# Patient Record
Sex: Female | Born: 1990 | Race: White | Hispanic: No | Smoking: Never smoker
Health system: Southern US, Community
[De-identification: ages and names within clinical notes are randomized; demographics above are authoritative.]

## PROBLEM LIST (undated history)

## (undated) DIAGNOSIS — F419 Anxiety disorder, unspecified: Secondary | ICD-10-CM

## (undated) DIAGNOSIS — G919 Hydrocephalus, unspecified: Secondary | ICD-10-CM

---

## 2014-09-02 DIAGNOSIS — N946 Dysmenorrhea, unspecified: Secondary | ICD-10-CM | POA: Insufficient documentation

## 2018-12-05 ENCOUNTER — Ambulatory Visit
Admission: EM | Admit: 2018-12-05 | Discharge: 2018-12-05 | Disposition: A | Discharge status: Home / Self Care | Payer: BLUE CROSS/BLUE SHIELD | Attending: Family Medicine | Admitting: Family Medicine

## 2018-12-05 ENCOUNTER — Encounter: Payer: Self-pay | Admitting: Emergency Medicine

## 2018-12-05 ENCOUNTER — Other Ambulatory Visit: Payer: Self-pay

## 2018-12-05 DIAGNOSIS — J069 Acute upper respiratory infection, unspecified: Secondary | ICD-10-CM | POA: Insufficient documentation

## 2018-12-05 LAB — RAPID STREP SCREEN (MED CTR MEBANE ONLY): Streptococcus, Group A Screen (Direct): NEGATIVE

## 2018-12-05 MED ORDER — IPRATROPIUM BROMIDE 0.06 % NA SOLN: 2.0000 | Freq: Four times a day (QID) | NASAL | 0 refills | Status: DC | PRN

## 2018-12-05 MED ORDER — CETIRIZINE-PSEUDOEPHEDRINE ER 5-120 MG PO TB12: 1.0000 | ORAL_TABLET | Freq: Two times a day (BID) | ORAL | 0 refills | Status: AC

## 2018-12-05 NOTE — ED Triage Notes (Signed)
Patient c/o sore throat and sinus congestion and pressure for the past 3 days.  Patient denies fevers.

## 2018-12-05 NOTE — Discharge Instructions (Signed)
This is viral.  Strep negative.  Medication as directed.  You can stop your OTC antihistamine currently.  Take care  Dr. Lacinda Axon

## 2018-12-05 NOTE — ED Provider Notes (Signed)
[facility]    CSN: [id number] Arrival date & time: 12/05/18  0932  History   Chief Complaint Chief Complaint  Patient presents with  . Sore Throat   HPI  28 year old female presents with upper respiratory symptoms.  Patient reports that she has been sick for the past 3 days.  Reports sore throat, congestion.  Sore throat is severe.  She also reports postnasal drip.  No fever.  She has taken over-the-counter allergy medication without improvement.  Symptoms are interfering with sleep.  No known exacerbating factors.  No other associated symptoms.  No other complaints.  History reviewed and updated as below. PMH: Dysmenorrhea  Home Medications    Prior to Admission medications   Medication Sig Start Date End Date Taking? Authorizing Provider  Norgestimate-Ethinyl Estradiol Triphasic (TRI-SPRINTEC) 0.18/0.215/0.25 MG-35 MCG tablet TAKE ONE TABLET BY MOUTH ONCE DAILY 07/14/18  Yes [provider]  cetirizine-pseudoephedrine (ZYRTEC-D) 5-120 MG tablet Take 1 tablet by mouth 2 (two) times daily. 12/05/18   Thersa Salt G, DO  ipratropium (ATROVENT) 0.06 % nasal spray Place 2 sprays into both nostrils 4 (four) times daily as needed for rhinitis. 12/05/18   Coral Spikes, DO   Family History Family History  Family history unknown: Yes   Social History Social History   Tobacco Use  . Smoking status: Never Smoker  . Smokeless tobacco: Never Used  Substance Use Topics  . Alcohol use: Never    Frequency: Never  . Drug use: Never     Allergies   Patient has no known allergies.   Review of Systems Review of Systems  Constitutional: Negative for fever.  HENT: Positive for congestion, postnasal drip and sore throat.    Physical Exam Triage Vital Signs ED Triage Vitals  Enc Vitals Group     BP 12/05/18 0957 123/78     Pulse Rate 12/05/18 0957 79     Resp 12/05/18 0957 14     Temp 12/05/18 0957 98.4 F (36.9 C)     Temp Source 12/05/18 0957 Oral   SpO2 12/05/18 0957 100 %     Weight 12/05/18 0954 170 lb (77.1 kg)     Height 12/05/18 0954 5\' 4"  (1.626 m)     Head Circumference --      Peak Flow --      Pain Score 12/05/18 0954 6     Pain Loc --      Pain Edu? --      Excl. in [place]? --    Updated Vital Signs BP 123/78 (BP Location: Left Arm)   Pulse 79   Temp 98.4 F (36.9 C) (Oral)   Resp 14   Ht 5\' 4"  (1.626 m)   Wt 77.1 kg   LMP 11/28/2018 (Approximate)   SpO2 100%   BMI 29.18 kg/m   Visual Acuity Right Eye Distance:   Left Eye Distance:   Bilateral Distance:    Right Eye Near:   Left Eye Near:    Bilateral Near:     Physical Exam Vitals signs and nursing note reviewed.  Constitutional:      General: She is not in acute distress. HENT:     Head: Normocephalic and atraumatic.     Right Ear: Tympanic membrane normal.     Left Ear: Tympanic membrane normal.     Nose: Nose normal.     Mouth/Throat:     Pharynx: Oropharynx is clear. No oropharyngeal exudate.  Eyes:     General:  Right eye: No discharge.        Left eye: No discharge.     Conjunctiva/sclera: Conjunctivae normal.  Cardiovascular:     Rate and Rhythm: Normal rate and regular rhythm.  Pulmonary:     Effort: Pulmonary effort is normal.     Breath sounds: No wheezing, rhonchi or rales.  Neurological:     Mental Status: She is alert.  Psychiatric:        Mood and Affect: Mood normal.        Behavior: Behavior normal.    UC Treatments / Results  Labs (all labs ordered are listed, but only abnormal results are displayed) Labs Reviewed  RAPID STREP SCREEN ([facility] ONLY)  CULTURE, GROUP A STREP [name])    EKG None  Radiology No results found.  Procedures Procedures (including critical care time)  Medications Ordered in UC Medications - No data to display  Initial Impression / Assessment and Plan / UC Course  I have reviewed the triage vital signs and the nursing notes.  Pertinent labs & imaging results that were  available during my care of the patient were reviewed by me and considered in my medical decision making (see chart for details).    28 year old female presents with a viral URI.  Strep negative.  Treating with Atrovent nasal spray and Zyrtec-D.  Final Clinical Impressions(s) / UC Diagnoses   Final diagnoses:  Viral URI     Discharge Instructions     This is viral.  Strep negative.  Medication as directed.  You can stop your OTC antihistamine currently.  Take care  Dr. Lacinda Axon    ED Prescriptions    Medication Sig Dispense Auth. Provider   ipratropium (ATROVENT) 0.06 % nasal spray Place 2 sprays into both nostrils 4 (four) times daily as needed for rhinitis. 15 mL [name] G, DO   cetirizine-pseudoephedrine (ZYRTEC-D) 5-120 MG tablet Take 1 tablet by mouth 2 (two) times daily. 30 tablet Coral Spikes, DO     Controlled Substance Prescriptions [place] Controlled Substance Registry consulted? Not Applicable   Coral Spikes, DO 12/05/18 1032

## 2018-12-08 LAB — CULTURE, GROUP A STREP (THRC)

## 2020-03-30 ENCOUNTER — Ambulatory Visit
Admission: EM | Admit: 2020-03-30 | Discharge: 2020-03-30 | Disposition: A | Discharge status: Home / Self Care | Payer: PRIVATE HEALTH INSURANCE | Attending: Family Medicine | Admitting: Family Medicine

## 2020-03-30 ENCOUNTER — Other Ambulatory Visit: Payer: Self-pay

## 2020-03-30 ENCOUNTER — Encounter: Payer: Self-pay | Admitting: Emergency Medicine

## 2020-03-30 DIAGNOSIS — L03213 Periorbital cellulitis: Secondary | ICD-10-CM | POA: Diagnosis not present

## 2020-03-30 DIAGNOSIS — H10022 Other mucopurulent conjunctivitis, left eye: Secondary | ICD-10-CM

## 2020-03-30 MED ORDER — SULFAMETHOXAZOLE-TRIMETHOPRIM 800-160 MG PO TABS: 1.0000 | ORAL_TABLET | Freq: Two times a day (BID) | ORAL | 0 refills | Status: DC

## 2020-03-30 MED ORDER — MOXIFLOXACIN HCL 0.5 % OP SOLN: 1.0000 [drp] | Freq: Three times a day (TID) | OPHTHALMIC | 0 refills | Status: DC

## 2020-03-30 NOTE — ED Triage Notes (Signed)
Patient c/o left eye swelling that started yesterday. She states she felt like something got into her left eye yesterday.

## 2020-04-02 NOTE — ED Provider Notes (Signed)
[facility]    CSN: [id number] Arrival date & time: 03/30/20  1247      History   Chief Complaint Chief Complaint  Patient presents with  . Facial Swelling    HPI Beth Zhang is a 29 y.o. female.   29 yo female with a c/o left eyelids swelling, eye redness and drainage for the past 2 days. Denies any trauma, fevers, chills, vision changes. States does not wear contacts.      History reviewed. No pertinent past medical history.  There are no problems to display for this patient.   History reviewed. No pertinent surgical history.  OB History   No obstetric history on file.      Home Medications    Prior to Admission medications   Medication Sig Start Date End Date Taking? Authorizing Provider  cetirizine-pseudoephedrine (ZYRTEC-D) 5-120 MG tablet Take 1 tablet by mouth 2 (two) times daily. 12/05/18  Yes Cook, Jayce G, DO  ipratropium (ATROVENT) 0.06 % nasal spray Place 2 sprays into both nostrils 4 (four) times daily as needed for rhinitis. 12/05/18  Yes Cook, Jayce G, DO  Norgestimate-Ethinyl Estradiol Triphasic (TRI-SPRINTEC) 0.18/0.215/0.25 MG-35 MCG tablet TAKE ONE TABLET BY MOUTH ONCE DAILY 07/14/18  Yes [provider]  moxifloxacin (VIGAMOX) 0.5 % ophthalmic solution Place 1 drop into the left eye 3 (three) times daily. 03/30/20   Norval Gable, MD  sulfamethoxazole-trimethoprim (BACTRIM DS) 800-160 MG tablet Take 1 tablet by mouth 2 (two) times daily. 03/30/20   Norval Gable, MD    Family History Family History  Family history unknown: Yes    Social History Social History   Tobacco Use  . Smoking status: Never Smoker  . Smokeless tobacco: Never Used  Substance Use Topics  . Alcohol use: Never  . Drug use: Never     Allergies   Patient has no known allergies.   Review of Systems Review of Systems   Physical Exam Triage Vital Signs ED Triage Vitals  Enc Vitals Group     BP 03/30/20 1305 (!) 145/96     Pulse  Rate 03/30/20 1305 97     Resp 03/30/20 1305 18     Temp 03/30/20 1305 98.5 F (36.9 C)     Temp Source 03/30/20 1305 Oral     SpO2 03/30/20 1305 100 %     Weight 03/30/20 1304 170 lb (77.1 kg)     Height 03/30/20 1304 5\' 4"  (1.626 m)     Head Circumference --      Peak Flow --      Pain Score 03/30/20 1304 0     Pain Loc --      Pain Edu? --      Excl. in [place]? --    No data found.  Updated Vital Signs BP (!) 145/96 (BP Location: Right Arm)   Pulse 97   Temp 98.5 F (36.9 C) (Oral)   Resp 18   Ht 5\' 4"  (1.626 m)   Wt 77.1 kg   LMP 03/09/2020   SpO2 100%   BMI 29.18 kg/m   Visual Acuity Right Eye Distance: 20/30 uncorrected Left Eye Distance: 20/40 uncorrected Bilateral Distance: 20/40 uncorrected  Right Eye Near:   Left Eye Near:    Bilateral Near:     Physical Exam Vitals and nursing note reviewed.  Constitutional:      General: She is not in acute distress.    Appearance: She is not toxic-appearing or diaphoretic.  Eyes:  General:        Left eye: Discharge present.    Extraocular Movements: Extraocular movements intact.     Conjunctiva/sclera:     Left eye: Left conjunctiva is injected. Exudate present.     Pupils: Pupils are equal, round, and reactive to light.     Left eye: No corneal abrasion or fluorescein uptake.     Comments: Left upper and lower eyelid with edema, blanchable erythema and tenderness to palpation  Neurological:     Mental Status: She is alert.      UC Treatments / Results  Labs (all labs ordered are listed, but only abnormal results are displayed) Labs Reviewed - No data to display  EKG   Radiology No results found.  Procedures Procedures (including critical care time)  Medications Ordered in UC Medications - No data to display  Initial Impression / Assessment and Plan / UC Course  I have reviewed the triage vital signs and the nursing notes.  Pertinent labs & imaging results that were available during my care  of the patient were reviewed by me and considered in my medical decision making (see chart for details).      Final Clinical Impressions(s) / UC Diagnoses   Final diagnoses:  Other mucopurulent conjunctivitis of left eye  Preseptal cellulitis of left eye    ED Prescriptions    Medication Sig Dispense Auth. Provider   moxifloxacin (VIGAMOX) 0.5 % ophthalmic solution Place 1 drop into the left eye 3 (three) times daily. 3 mL Norval Gable, MD   sulfamethoxazole-trimethoprim (BACTRIM DS) 800-160 MG tablet Take 1 tablet by mouth 2 (two) times daily. 14 tablet Erica Richwine, Linward Foster, MD      1. diagnosis reviewed with patient 2. rx as per orders above; reviewed possible side effects, interactions, risks and benefits  3. Recommend supportive treatment with warm compresses to area; otc analgesics prn  4. Follow-up prn if symptoms worsen or don't improve  PDMP not reviewed this encounter.   Norval Gable, MD 04/02/20 [phone]

## 2021-02-24 ENCOUNTER — Encounter: Payer: Self-pay | Admitting: Emergency Medicine

## 2021-02-24 ENCOUNTER — Other Ambulatory Visit: Payer: Self-pay

## 2021-02-24 ENCOUNTER — Ambulatory Visit (INDEPENDENT_AMBULATORY_CARE_PROVIDER_SITE_OTHER): Payer: PRIVATE HEALTH INSURANCE

## 2021-02-24 ENCOUNTER — Ambulatory Visit
Admission: EM | Admit: 2021-02-24 | Discharge: 2021-02-24 | Disposition: A | Discharge status: Home / Self Care | Payer: PRIVATE HEALTH INSURANCE | Attending: Emergency Medicine | Admitting: Emergency Medicine

## 2021-02-24 DIAGNOSIS — R0781 Pleurodynia: Secondary | ICD-10-CM | POA: Diagnosis not present

## 2021-02-24 DIAGNOSIS — R079 Chest pain, unspecified: Secondary | ICD-10-CM

## 2021-02-24 DIAGNOSIS — R0789 Other chest pain: Secondary | ICD-10-CM | POA: Diagnosis not present

## 2021-02-24 HISTORY — DX: Anxiety disorder, unspecified: F41.9

## 2021-02-24 MED ORDER — TIZANIDINE HCL 4 MG PO TABS: 4.0000 mg | ORAL_TABLET | Freq: Three times a day (TID) | ORAL | 0 refills | Status: DC | PRN

## 2021-02-24 MED ORDER — IBUPROFEN 600 MG PO TABS: 600.0000 mg | ORAL_TABLET | Freq: Four times a day (QID) | ORAL | 0 refills | Status: DC | PRN

## 2021-02-24 NOTE — ED Provider Notes (Signed)
HPI  SUBJECTIVE:  Beth Zhang is a 30 y.o. female who presents with 1 week of bilateral, daily, constant lower anterior sharp rib pain present with inspiration that got worse today.  She reports a cough and shortness of breath present only with coughing  this morning.  No wheezing.  No other shortness of breath.  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  No recent viral illness, recent vaccines, specifically COVID vaccine.  No fevers, trauma to the chest.  No calf pain, swelling, hemoptysis, surgery in the past 4 weeks, recent immobilization, exogenous estrogen use.  She has a Nexplanon.  No nausea, vomiting, abdominal pain.  She tried 220 mg of Aleve once daily with improvement in her symptoms.  She also states that rest/not working helps.  Symptoms are worse with deep inspiration, torso rotation, arm movement and with bending over.  She has never had symptoms like this before.  Past medical history negative for pulmonary disease, smoking of COVID, diabetes, pneumothorax, hypertension, PE, DVT, hypercoagulability.  LMP: 3 weeks ago.  Denies the possibility of being pregnant.  PMD: Sofie Hartigan, MD     Past Medical History:  Diagnosis Date  . Anxiety     History reviewed. No pertinent surgical history.  Family History  Family history unknown: Yes    Social History   Tobacco Use  . Smoking status: Never Smoker  . Smokeless tobacco: Never Used  Vaping Use  . Vaping Use: Never used  Substance Use Topics  . Alcohol use: Never  . Drug use: Never    No current facility-administered medications for this encounter.  Current Outpatient Medications:  .  cetirizine-pseudoephedrine (ZYRTEC-D) 5-120 MG tablet, Take 1 tablet by mouth 2 (two) times daily., Disp: 30 tablet, Rfl: 0 .  etonogestrel (NEXPLANON) 68 MG IMPL implant, 1 each by Subdermal route once., Disp: , Rfl:  .  FLUoxetine (PROZAC) 20 MG capsule, Take 20 mg by mouth daily., Disp: , Rfl:  .   ibuprofen (ADVIL) 600 MG tablet, Take 1 tablet (600 mg total) by mouth every 6 (six) hours as needed., Disp: 30 tablet, Rfl: 0 .  ipratropium (ATROVENT) 0.06 % nasal spray, Place 2 sprays into both nostrils 4 (four) times daily as needed for rhinitis., Disp: 15 mL, Rfl: 0 .  tiZANidine (ZANAFLEX) 4 MG tablet, Take 1 tablet (4 mg total) by mouth every 8 (eight) hours as needed for muscle spasms., Disp: 30 tablet, Rfl: 0  No Known Allergies   ROS  As noted in HPI.   Physical Exam  BP 125/83 (BP Location: Right Arm)   Pulse 77   Temp 98.2 F (36.8 C) (Oral)   Resp 14   Ht 5\' 4"  (1.626 m)   Wt 74.8 kg   SpO2 100%   BMI 28.32 kg/m   Constitutional: Well developed, well nourished, no acute distress Eyes:  EOMI, conjunctiva normal bilaterally HENT: Normocephalic, atraumatic,mucus membranes moist Respiratory: Normal inspiratory effort, lungs clear bilaterally.  Good air movement.  Positive tenderness along the lower ribs bilaterally, particularly at the costochondral junctions.  This reproduces her chest pain.   Cardiovascular: Normal rate, regular rhythm, no murmurs rubs or gallop GI: nondistended soft, nontender, no rebound, guarding.  Negative Murphy.  No splenomegaly. Back: No CVAT. skin: No rash, skin intact Musculoskeletal: Calves symmetric, nontender, no edema. Neurologic: Alert & oriented x 3, no focal neuro deficits Psychiatric: Speech and behavior appropriate   ED Course   Medications -  No data to display  Orders Placed This Encounter  Procedures  . DG Chest 2 View    Standing Status:   Standing    Number of Occurrences:   1    Order Specific Question:   Reason for Exam (SYMPTOM  OR DIAGNOSIS REQUIRED)    Answer:   bilateral lower rib pain r/o pleural effusion, PNA, PTX, pulm edema  . ED EKG    Standing Status:   Standing    Number of Occurrences:   1    Order Specific Question:   Reason for Exam    Answer:   Chest Pain  . EKG 12-Lead    Standing Status:    Standing    Number of Occurrences:   1    No results found for this or any previous visit (from the past 24 hour(s)). DG Chest 2 View  Result Date: 02/24/2021 CLINICAL DATA:  Bilateral lower rib pain.  Chest pain. EXAM: CHEST - 2 VIEW COMPARISON:  Radiograph 01/30/2021 report, images not available. FINDINGS: The cardiomediastinal contours are normal. The lungs are clear. Pulmonary vasculature is normal. No consolidation, pleural effusion, or pneumothorax. No acute osseous abnormalities are seen. IMPRESSION: Negative radiographs of the chest. Electronically Signed   By: Keith Rake M.D.   On: 02/24/2021 17:00    ED Clinical Impression  1. Musculoskeletal chest pain      ED Assessment/Plan  Checking EKG, chest x-ray.  EKG: Normal sinus rhythm, rate 72.  Normal axis, normal intervals.  No hypertrophy.  No ST elevation.  No previous EKG for comparison  Reviewed imaging independently.  Normal chest x-ray. see radiology report for full details.  Patient with reproducible chest pain.  EKG, chest x-ray reassuring.  She does a lot of heavy lifting as a CNA.  Presentation most consistent with musculoskeletal chest pain.  She is PERC negative.  Will send home with Naprosyn 500 mg twice daily for her to take with 1000 g of Tylenol.  Zanaflex.  She is off over the weekend.  We will give her a work note for Monday and Tuesday.  She will follow-up with her PMD as needed, strict ER return precautions given to her and her parent.   Discussed ekg, imaging, MDM, treatment plan, and plan for follow-up with patient and parent. Discussed sn/sx that should prompt return to the ED. They agree with plan.   Meds ordered this encounter  Medications  . ibuprofen (ADVIL) 600 MG tablet    Sig: Take 1 tablet (600 mg total) by mouth every 6 (six) hours as needed.    Dispense:  30 tablet    Refill:  0  . tiZANidine (ZANAFLEX) 4 MG tablet    Sig: Take 1 tablet (4 mg total) by mouth every 8 (eight) hours as  needed for muscle spasms.    Dispense:  30 tablet    Refill:  0      *This clinic note was created using [employer]. Therefore, there may be occasional mistakes despite careful proofreading.  ?    Melynda Ripple, MD 02/24/21 1712

## 2021-02-24 NOTE — ED Triage Notes (Signed)
Patient c/o chest pain when she breaths in or takes a deep breath.  Patient states that this has been going on for a week.  Patient denies SOB.  Patient denies any cold symptoms.

## 2021-02-24 NOTE — Discharge Instructions (Addendum)
Your EKG and chest x-ray were both normal.  I suspect that this is from all of the physical activity you do at work.  Take 600 mg of ibuprofen, 1000 mg of Tylenol together 3-4 times a day as needed for pain.  Zanaflex as needed for muscle spasms.

## 2021-04-13 DIAGNOSIS — G43009 Migraine without aura, not intractable, without status migrainosus: Secondary | ICD-10-CM | POA: Insufficient documentation

## 2021-05-07 HISTORY — PX: OTHER SURGICAL HISTORY: SHX169

## 2021-07-07 DIAGNOSIS — Q030 Malformations of aqueduct of Sylvius: Secondary | ICD-10-CM | POA: Insufficient documentation

## 2021-07-31 DIAGNOSIS — G911 Obstructive hydrocephalus: Secondary | ICD-10-CM | POA: Insufficient documentation

## 2021-10-22 IMAGING — CR DG CHEST 2V
2 series · 2 of 2 positions shown · non-contrast
Comparison: Radiograph 01/30/2021 report, images not available.

CLINICAL DATA: Bilateral lower rib pain.  Chest pain.

EXAM:
CHEST - 2 VIEW

[chest pa]
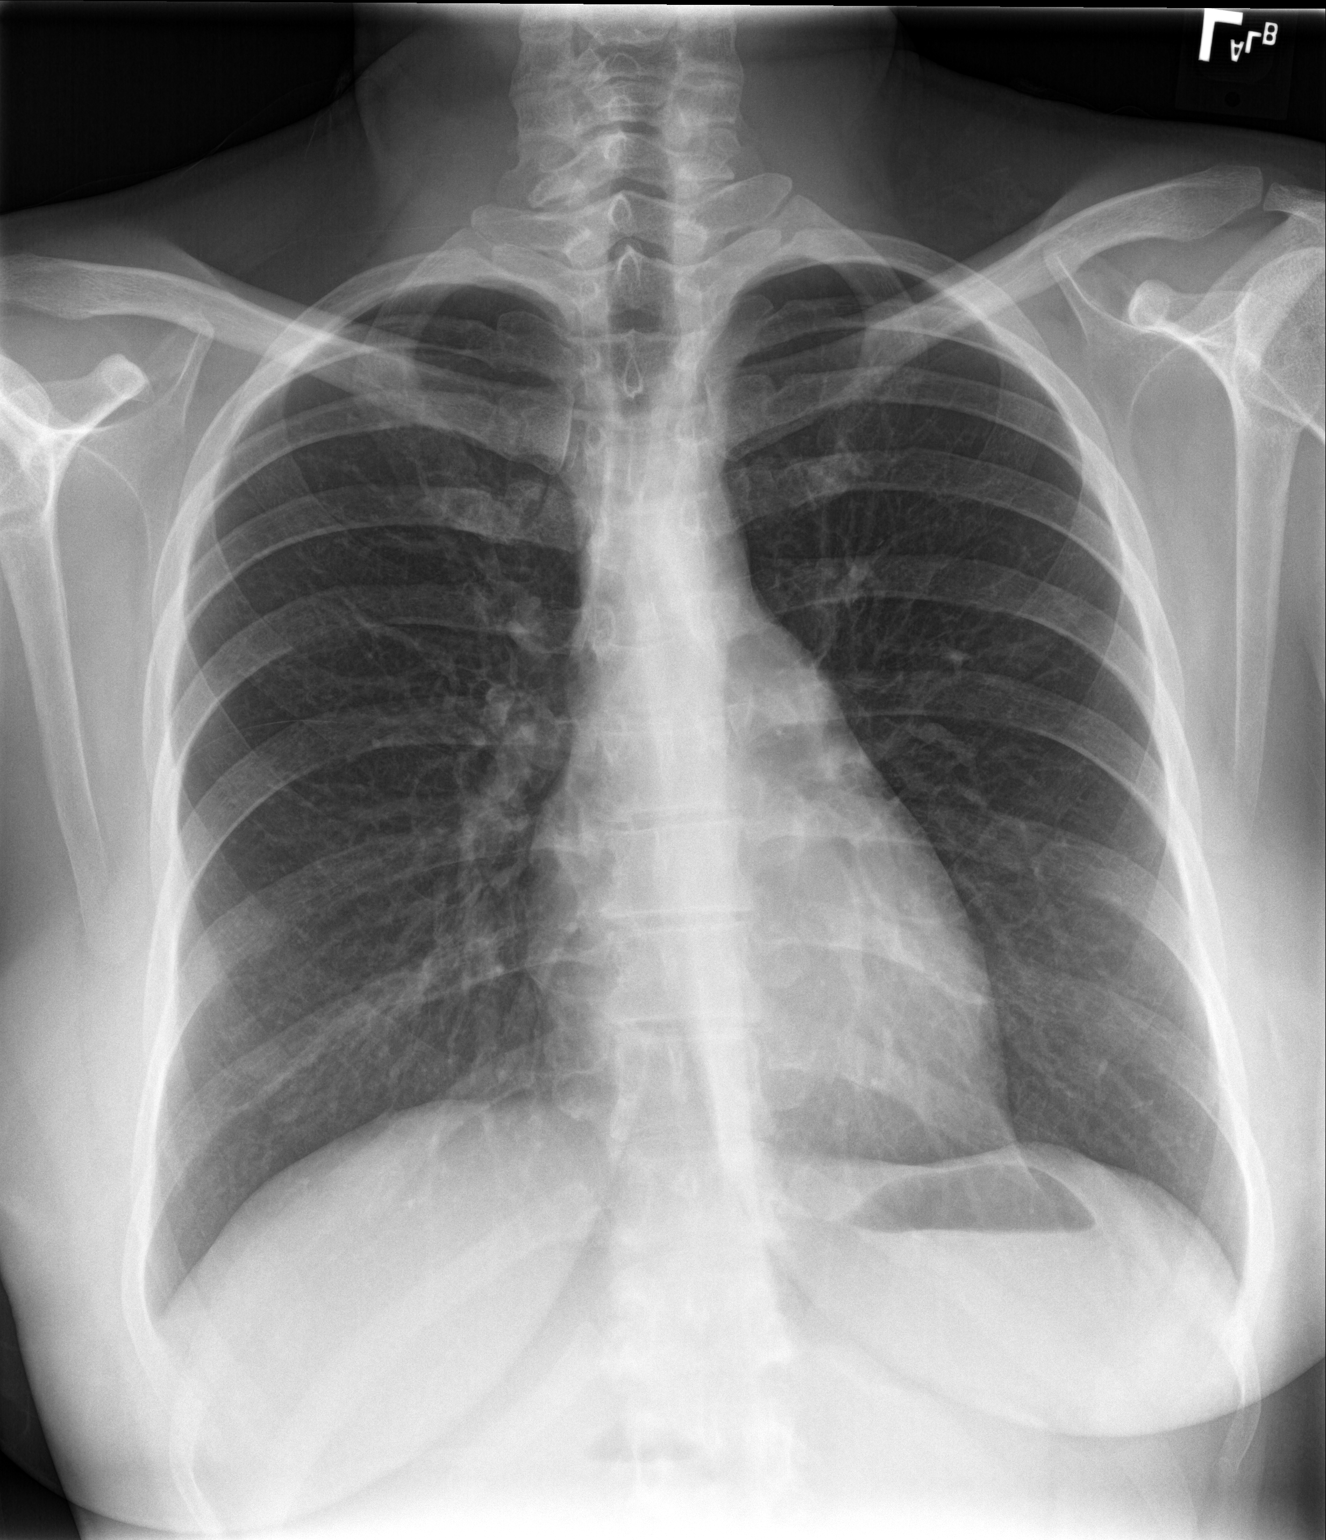

[chest lat]
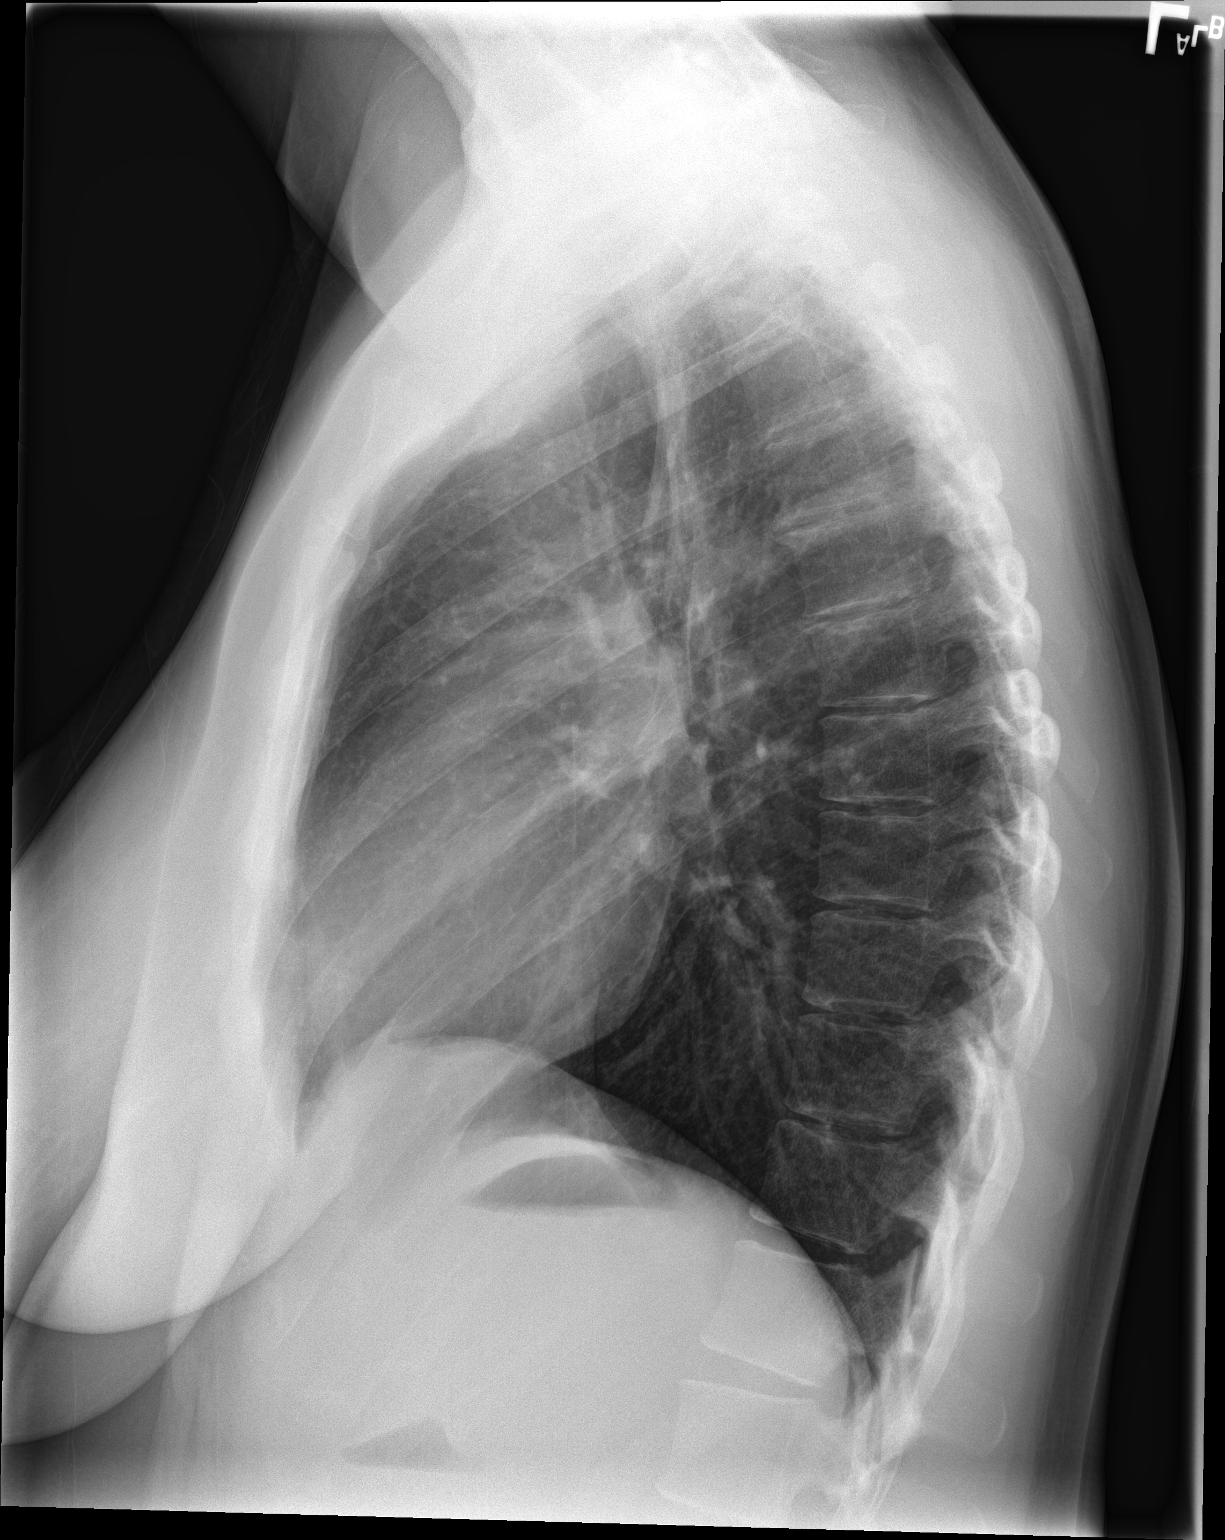

[2 of 2 positions shown; findings below may reference images not displayed]

FINDINGS: The cardiomediastinal contours are normal. The lungs are clear.
Pulmonary vasculature is normal. No consolidation, pleural effusion,
or pneumothorax. No acute osseous abnormalities are seen.
IMPRESSION: Negative radiographs of the chest.

## 2021-12-05 ENCOUNTER — Other Ambulatory Visit: Payer: Self-pay | Admitting: Obstetrics and Gynecology

## 2021-12-05 NOTE — H&P (Signed)
Beth Zhang is a 31 y.o. female presenting with Pre Op Consulting (Ref RAM - adnexal mass, surgical consult)  on 11/30/2021   History of Present Illness: New pt to me, established at practice, presents today to discuss surgical removal of 6 cm right adnexal mass.    Today: She has questions about her cyst and surgical plans. She has been experiencing a lot of pelvic pain on her right side. She has had pain intermittently since December. She was seen at the ER when pain was severe. She still has pain, but it has improved.   Also of note, she  has heavy painful periods. They have always been like this for her. The cramps are awful. She does have a Nexplanon which has helped with cramps and flow, but her periods are much more irregular now.    Workup:  Pap: 03/2020 NILM   TVUS 11/09/21 FINDINGS:  The uterus measures 6.1 x 3.2 x 3.3 cm. The endometrium measures 6-7 mm in thickness.  No myometrial mass is seen.  The right ovary measures 7.7 x 5.0 x 5.1 cm. The right ovary demonstrates a hypoechoic area with a thin septations and some possible areas of mural nodularity measuring up to 5.8 x 5.3 x 5.7 cm.  The left ovary measures 4.0 x 2.6 x 3.8 cm.  Spectral Doppler evaluation of the ovaries was obtained. Normal arterial waveforms were obtained from both ovaries.  Trace free fluid is seen in the cul-de-sac.   IMPRESSION:  There is a hypoechoic area seen within the right ovary which demonstrates thin septations and some areas of possible mural nodularity. This could represent a physiologic cyst or hydrosalpinx. This could also represent a serous or mucinous neoplasm. Interval follow-up within 6-8 weeks with pelvic ultrasound is recommended.    TVUS 11/29/2020:  Uterus 5.7 x 3.5 x 4.1 cm  Endometrium=4.77 mm RT OV simple cyst= 1.92 cm Rt adnexal cystic mass seen with solid nodule = 4.56 x 5.51 x 6.04 cm nodule= 1.23 x 0.64 x 0.81 cm  LT OV  wnl  on abd [place] Free fluid  pcds Rt ov doppler  art and venous waveforms seen         Past Medical History:  has a past medical history of Allergic rhinitis and Dysmenorrhea.  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 Family History: family history is not on file. She was adopted. Social History:  reports that she has never smoked. She has never used smokeless tobacco. She reports that she does not drink alcohol and does not use drugs. OB/GYN History:  OB History     Gravida 0   Para 0   Term 0   Preterm 0   AB 0   Living 0     SAB 0   IAB 0   Ectopic 0   Molar 0   Multiple 0   Live Births 0        Allergies: has No Known Allergies. Medications:   Current Outpatient Medications:    cetirizine-pseudoephedrine (ZYRTEC-D) 5-120 mg tablet, Take by mouth every morning, Disp: , Rfl:    etonorgestrel (NEXPLANON) 68 mg implant, Inject subcutaneously, Disp: , Rfl:    ipratropium (ATROVENT) 0.06 % nasal spray, by Nasal route, Disp: , Rfl:    ketorolac (TORADOL) 10 mg tablet, Take 10 mg by mouth every 6 (six) hours as needed for Pain, Disp: , Rfl:  meloxicam (MOBIC) 15 MG tablet, Take 1 tablet (15 mg total) by mouth once daily, Disp: 30 tablet, Rfl: 0   nortriptyline (PAMELOR) 10 MG capsule, Take 10 mg at night for 1 week, then increase to 20 mg and continue, Disp: 30 capsule, Rfl: 11   ondansetron (ZOFRAN-ODT) 4 MG disintegrating tablet, Take 4 mg by mouth every 8 (eight) hours as needed, Disp: , Rfl:    rizatriptan (MAXALT) 10 MG tablet, Take 10 mg by mouth once as needed for Migraine May take a second dose after 2 hours if needed. (Patient not taking: Reported on 11/29/2021), Disp: , Rfl:    sennosides (SENOKOT) 8.6 mg tablet, Take 1 tablet by mouth once daily, Disp: 30 tablet, Rfl: 0   SUMAtriptan (IMITREX) 100 MG tablet, Take 1 tablet (100 mg total) by mouth as  directed for Migraine May take a second dose after 2 hours if needed. (Patient not taking: Reported on 11/29/2021), Disp: 40 tablet, Rfl: 0   Review of Systems: No SOB, no palpitations or chest pain, no new lower extremity edema, no nausea or vomiting or bowel or bladder complaints. See HPI for gyn specific ROS.    Exam:   BP 120/62    Pulse 73    Ht 162.6 cm (5\' 4" )    Wt 64.9 kg (143 lb)    LMP 11/02/2021 (Approximate)    BMI 24.55 kg/m    General: Patient is well-groomed, well-nourished, appears stated age in no acute distress   HEENT: head is atraumatic and normocephalic, trachea is midline, neck is supple with no palpable nodules   CV: Regular rhythm and normal heart rate, no murmur   Pulm: Clear to auscultation throughout lung fields with no wheezing, crackles, or rhonchi. No increased work of breathing   Abdomen: soft , no mass, non-tender, no rebound tenderness, no hepatomegaly   Carnett's positive on right for visceral pain   Pelvic: deferred    Impression:   The primary encounter diagnosis was Right ovarian cyst. Diagnoses of Pelvic pain in female, Encounter for other general counseling or advice on contraception, Excessive or frequent menstruation, and Primary dysmenorrhea were also pertinent to this visit.   Plan:   1. Complex RO cyst, Pelvic pain  - Discussed with pt that concern for malignancy is low because of her age, smooth edges and clear fluid inside cyst. However, with solid nodule and large size of the cyst along with her pelvic pain, I discussed with patient my recommendation to surgically remove the cyst.  - If cancer markers return abnormal, will ask gyn onc to assist in surgery.  -Patient returns for a preoperative discussion regarding her plans to proceed with surgical treatment of her complex ovarian cyst by laparoscopic ovarian cystectomy procedure.  The patient and I discussed the technical aspects of the procedure including the potential for risks and  complications.  These include but are not limited to the risk of infection requiring post-operative antibiotics or further procedures.  We talked about the risk of injury to adjacent organs including bladder, bowel, ureter, blood vessels or nerves.  We talked about the need to convert to an open incision.  We talked about the possible need for blood transfusion.  We talked about postop complications such as thromboembolic or cardiopulmonary complications.  All of her questions were answered.   2. Contraceptive counseling, Dysmenorrhea, Menorrhagia   - Nexplanon in place since 09/2019.  - Today we discussed alternative options for menstrual control and contraception including replacing her implant  and hormonal IUDs - Discussed that IUD is FDA approved for heavy and painful periods.  - Handouts given for review    Return for PreOp visit.    ~~~~~~~~~~~~~~~~~~~~~~~~~~~~~~~~~~~~~~~~~~~~~~~~~~~~~~~~~~~~ This note is partially written by Geraldine Solar, in the presence of and acting as the scribe of Dr. [name], who has reviewed, edited and added to the note to reflect her best personal medical judgment.   This note was generated in part with voice recognition software and I apologize for any typographical errors that were not detected and corrected.     I personally performed the service. (TP)   Xitlaly Ault Monika Salk, MD

## 2021-12-06 ENCOUNTER — Other Ambulatory Visit: Payer: Self-pay | Admitting: Obstetrics and Gynecology

## 2021-12-08 ENCOUNTER — Other Ambulatory Visit
Admission: RE | Admit: 2021-12-08 | Discharge: 2021-12-08 | Disposition: A | Discharge status: Home / Self Care | Payer: BC Managed Care – PPO | Source: Ambulatory Visit | Attending: Obstetrics and Gynecology | Admitting: Obstetrics and Gynecology

## 2021-12-08 ENCOUNTER — Other Ambulatory Visit: Payer: Self-pay

## 2021-12-08 DIAGNOSIS — Z01812 Encounter for preprocedural laboratory examination: Secondary | ICD-10-CM | POA: Insufficient documentation

## 2021-12-08 DIAGNOSIS — R102 Pelvic and perineal pain: Secondary | ICD-10-CM | POA: Insufficient documentation

## 2021-12-08 DIAGNOSIS — D270 Benign neoplasm of right ovary: Secondary | ICD-10-CM | POA: Diagnosis not present

## 2021-12-08 DIAGNOSIS — N736 Female pelvic peritoneal adhesions (postinfective): Secondary | ICD-10-CM | POA: Diagnosis not present

## 2021-12-08 DIAGNOSIS — N946 Dysmenorrhea, unspecified: Secondary | ICD-10-CM | POA: Diagnosis present

## 2021-12-08 DIAGNOSIS — N920 Excessive and frequent menstruation with regular cycle: Secondary | ICD-10-CM | POA: Diagnosis not present

## 2021-12-08 LAB — BASIC METABOLIC PANEL
Anion gap: 7 (ref 5–15)
BUN: 13 mg/dL (ref 6–20)
CO2: 24 mmol/L (ref 22–32)
Calcium: 9.3 mg/dL (ref 8.9–10.3)
Chloride: 106 mmol/L (ref 98–111)
Creatinine, Ser: 0.85 mg/dL (ref 0.44–1.00)
GFR, Estimated: >60 mL/min (ref >60)
Glucose, Bld: 85 mg/dL (ref 70–99)
Potassium: 4.6 mmol/L (ref 3.5–5.1)
Sodium: 137 mmol/L (ref 135–145)

## 2021-12-08 LAB — CBC
HCT: 39.5 % (ref 36.0–46.0)
Hemoglobin: 13.5 g/dL (ref 12.0–15.0)
MCH: 29.8 pg (ref 26.0–34.0)
MCHC: 34.2 g/dL (ref 30.0–36.0)
MCV: 87.2 fL (ref 80.0–100.0)
Platelets: 253 10*3/uL (ref 150–400)
RBC: 4.53 MIL/uL (ref 3.87–5.11)
RDW: 12.5 % (ref 11.5–15.5)
WBC: 8.4 10*3/uL (ref 4.0–10.5)
nRBC: 0 % (ref 0.0–0.2)

## 2021-12-08 NOTE — Patient Instructions (Signed)
Your procedure is scheduled on: Monday December 11, 2021. Report to Day Surgery inside [facility] 2nd floor. To find out your arrival time please call [phone] between 1PM - 3PM on Friday December 08, 2021.  Remember: Instructions that are not followed completely may result in serious medical risk,  up to and including death, or upon the discretion of your surgeon and anesthesiologist your  surgery may need to be rescheduled.     _X__ 1. Do not eat food after midnight the night before your procedure.                 No chewing gum or hard candies. You may drink clear liquids up to 2 hours                 before you are scheduled to arrive for your surgery- DO not drink clear                 liquids within 2 hours of the start of your surgery.                 Clear Liquids include:  water, apple juice without pulp, clear Gatorade, G2 or                  Gatorade Zero (avoid Red/Purple/Blue), Black Coffee or Tea (Do not add                 anything to coffee or tea).  __X__2.  On the morning of surgery brush your teeth with toothpaste and water, you                may rinse your mouth with mouthwash if you wish.  Do not swallow any toothpaste or mouthwash.     _X__ 3.  No Alcohol for 24 hours before or after surgery.   _X__ 4.  Do Not Smoke or use e-cigarettes For 24 Hours Prior to Your Surgery.                 Do not use any chewable tobacco products for at least 6 hours prior to                 Surgery.  _X__  5.  Do not use any recreational drugs (marijuana, cocaine, heroin, ecstasy, MDMA or other)                For at least one week prior to your surgery.  Combination of these drugs with anesthesia                May have life threatening results.  ____  6.  Bring all medications with you on the day of surgery if instructed.   __X__  7.  Notify your doctor if there is any change in your medical condition      (cold, fever, infections).     Do not wear  jewelry, make-up, hairpins, clips or nail polish. Do not wear lotions, powders, or perfumes. You may wear deodorant. Do not shave 48 hours prior to surgery. Men may shave face and neck. Do not bring valuables to the hospital.    [facility] is not responsible for any belongings or valuables.  Contacts, dentures or bridgework may not be worn into surgery. Leave your suitcase in the car. After surgery it may be brought to your room. For patients admitted to the hospital, discharge time is determined by your treatment team.   Patients discharged  the day of surgery will not be allowed to drive home.   Make arrangements for someone to be with you for the first 24 hours of your Same Day Discharge.   __X__ Take these medicines the morning of surgery with A SIP OF WATER:    1. None   2.   3.   4.  5.  6.  ____ Fleet Enema (as directed)   __X__ Use CHG Soap (or wipes) as directed  ____ Use Benzoyl Peroxide Gel as instructed  ____ Use inhalers on the day of surgery  ____ Stop metformin 2 days prior to surgery    ____ Take 1/2 of usual insulin dose the night before surgery. No insulin the morning          of surgery.   ____ Call your PCP, cardiologist, or Pulmonologist if taking Coumadin/Plavix/aspirin and ask when to stop before your surgery.   __X__ One Week prior to surgery- Stop Anti-inflammatories such as Ibuprofen, Aleve, Advil, Motrin, meloxicam (MOBIC), diclofenac, etodolac, ketorolac, Toradol, Daypro, piroxicam, Goody's or BC powders. OK TO USE TYLENOL IF NEEDED   __X__ Stop supplements until after surgery. Ascorbic Acid (VITAMIN C PO)   ____ Bring C-Pap to the hospital.    If you have any questions regarding your pre-procedure instructions,  Please call Pre-admit Testing at [phone]

## 2021-12-11 ENCOUNTER — Encounter: Payer: Self-pay | Admitting: Obstetrics and Gynecology

## 2021-12-11 ENCOUNTER — Other Ambulatory Visit: Payer: Self-pay

## 2021-12-11 ENCOUNTER — Ambulatory Visit: Payer: BC Managed Care – PPO | Admitting: Urgent Care

## 2021-12-11 ENCOUNTER — Ambulatory Visit
Admission: RE | Admit: 2021-12-11 | Discharge: 2021-12-11 | Disposition: A | Discharge status: Home / Self Care | Payer: BC Managed Care – PPO | Attending: Obstetrics and Gynecology | Admitting: Obstetrics and Gynecology

## 2021-12-11 ENCOUNTER — Encounter
Admission: RE | Disposition: A | Discharge status: Home / Self Care | Payer: Self-pay | Source: Home / Self Care | Attending: Obstetrics and Gynecology

## 2021-12-11 ENCOUNTER — Ambulatory Visit: Payer: BC Managed Care – PPO | Admitting: Anesthesiology

## 2021-12-11 DIAGNOSIS — N946 Dysmenorrhea, unspecified: Secondary | ICD-10-CM | POA: Insufficient documentation

## 2021-12-11 DIAGNOSIS — R102 Pelvic and perineal pain: Secondary | ICD-10-CM | POA: Insufficient documentation

## 2021-12-11 DIAGNOSIS — D270 Benign neoplasm of right ovary: Secondary | ICD-10-CM | POA: Insufficient documentation

## 2021-12-11 DIAGNOSIS — N736 Female pelvic peritoneal adhesions (postinfective): Secondary | ICD-10-CM | POA: Insufficient documentation

## 2021-12-11 DIAGNOSIS — N920 Excessive and frequent menstruation with regular cycle: Secondary | ICD-10-CM | POA: Insufficient documentation

## 2021-12-11 HISTORY — PX: LYSIS OF ADHESION: SHX5961

## 2021-12-11 HISTORY — PX: LAPAROSCOPIC OVARIAN CYSTECTOMY: SHX6248

## 2021-12-11 LAB — POCT PREGNANCY, URINE: Preg Test, Ur: NEGATIVE

## 2021-12-11 SURGERY — EXCISION, CYST, OVARY, LAPAROSCOPIC
Anesthesia: General | Site: Abdomen | Laterality: Right

## 2021-12-11 MED ORDER — ONDANSETRON HCL 4 MG/2ML IJ SOLN
INTRAMUSCULAR | Status: DC | PRN
  Administered 2021-12-11: 4 mg via INTRAVENOUS

## 2021-12-11 MED ORDER — DEXMEDETOMIDINE (PRECEDEX) IN NS 20 MCG/5ML (4 MCG/ML) IV SYRINGE: PREFILLED_SYRINGE | INTRAVENOUS | Status: DC | PRN

## 2021-12-11 MED ORDER — OXYCODONE HCL 5 MG PO TABS
5.0000 mg | ORAL_TABLET | Freq: Once | ORAL | Status: AC | PRN
  Administered 2021-12-11: 5 mg via ORAL

## 2021-12-11 MED ORDER — GABAPENTIN 800 MG PO TABS: 800.0000 mg | ORAL_TABLET | Freq: Every day | ORAL | 0 refills | Status: AC

## 2021-12-11 MED ORDER — DEXMEDETOMIDINE (PRECEDEX) IN NS 20 MCG/5ML (4 MCG/ML) IV SYRINGE
PREFILLED_SYRINGE | INTRAVENOUS | Status: DC | PRN
  Administered 2021-12-11: 8 ug via INTRAVENOUS

## 2021-12-11 MED ORDER — FAMOTIDINE 20 MG PO TABS
ORAL_TABLET | ORAL | Status: AC
  Administered 2021-12-11: 20 mg via ORAL
  Filled 2021-12-11: qty 1

## 2021-12-11 MED ORDER — CHLORHEXIDINE GLUCONATE 0.12 % MT SOLN
15.0000 mL | Freq: Once | OROMUCOSAL | Status: AC
Start: 2021-12-11 — End: 2021-12-11

## 2021-12-11 MED ORDER — ACETAMINOPHEN 500 MG PO TABS: 1000.0000 mg | ORAL_TABLET | Freq: Four times a day (QID) | ORAL | 0 refills | Status: AC

## 2021-12-11 MED ORDER — PROPOFOL 10 MG/ML IV BOLUS
INTRAVENOUS | Status: AC
  Filled 2021-12-11: qty 20

## 2021-12-11 MED ORDER — CHLORHEXIDINE GLUCONATE 0.12 % MT SOLN
OROMUCOSAL | Status: AC
  Administered 2021-12-11: 15 mL via OROMUCOSAL
  Filled 2021-12-11: qty 15

## 2021-12-11 MED ORDER — BUPIVACAINE HCL 0.5 % IJ SOLN
INTRAMUSCULAR | Status: DC | PRN
  Administered 2021-12-11: 22 mL

## 2021-12-11 MED ORDER — PROPOFOL 10 MG/ML IV BOLUS
INTRAVENOUS | Status: DC | PRN
  Administered 2021-12-11: 200 mg via INTRAVENOUS

## 2021-12-11 MED ORDER — FENTANYL CITRATE (PF) 100 MCG/2ML IJ SOLN
INTRAMUSCULAR | Status: AC
  Administered 2021-12-11: 50 ug via INTRAVENOUS
  Filled 2021-12-11: qty 2

## 2021-12-11 MED ORDER — IBUPROFEN 800 MG PO TABS: 800.0000 mg | ORAL_TABLET | Freq: Three times a day (TID) | ORAL | 1 refills | Status: AC

## 2021-12-11 MED ORDER — FENTANYL CITRATE (PF) 100 MCG/2ML IJ SOLN
INTRAMUSCULAR | Status: DC | PRN
  Administered 2021-12-11: 50 ug via INTRAVENOUS
  Administered 2021-12-11 (×2): 25 ug via INTRAVENOUS

## 2021-12-11 MED ORDER — ROCURONIUM BROMIDE 10 MG/ML (PF) SYRINGE
PREFILLED_SYRINGE | INTRAVENOUS | Status: AC
  Filled 2021-12-11: qty 10

## 2021-12-11 MED ORDER — 0.9 % SODIUM CHLORIDE (POUR BTL) OPTIME
TOPICAL | Status: DC | PRN
  Administered 2021-12-11: 500 mL

## 2021-12-11 MED ORDER — ACETAMINOPHEN 500 MG PO TABS: 1000.0000 mg | ORAL_TABLET | ORAL | Status: AC

## 2021-12-11 MED ORDER — LIDOCAINE HCL (PF) 2 % IJ SOLN
INTRAMUSCULAR | Status: AC
  Filled 2021-12-11: qty 5

## 2021-12-11 MED ORDER — OXYCODONE HCL 5 MG PO TABS
ORAL_TABLET | ORAL | Status: AC
  Filled 2021-12-11: qty 1

## 2021-12-11 MED ORDER — DROPERIDOL 2.5 MG/ML IJ SOLN
0.6250 mg | Freq: Once | INTRAMUSCULAR | Status: DC | PRN
  Filled 2021-12-11: qty 2

## 2021-12-11 MED ORDER — DOCUSATE SODIUM 100 MG PO CAPS
100.0000 mg | ORAL_CAPSULE | Freq: Two times a day (BID) | ORAL | 0 refills | Status: AC
Start: 2021-12-11 — End: ?

## 2021-12-11 MED ORDER — DEXAMETHASONE SODIUM PHOSPHATE 10 MG/ML IJ SOLN
INTRAMUSCULAR | Status: DC | PRN
  Administered 2021-12-11: 10 mg via INTRAVENOUS

## 2021-12-11 MED ORDER — PHENYLEPHRINE HCL (PRESSORS) 10 MG/ML IV SOLN
INTRAVENOUS | Status: DC | PRN
  Administered 2021-12-11 (×6): 100 ug via INTRAVENOUS

## 2021-12-11 MED ORDER — KETOROLAC TROMETHAMINE 30 MG/ML IJ SOLN
INTRAMUSCULAR | Status: DC | PRN
  Administered 2021-12-11: 30 mg via INTRAVENOUS

## 2021-12-11 MED ORDER — GABAPENTIN 300 MG PO CAPS: 300.0000 mg | ORAL_CAPSULE | ORAL | Status: AC

## 2021-12-11 MED ORDER — ONDANSETRON 4 MG PO TBDP: 4.0000 mg | ORAL_TABLET | Freq: Three times a day (TID) | ORAL | 0 refills | Status: DC | PRN

## 2021-12-11 MED ORDER — MIDAZOLAM HCL 2 MG/2ML IJ SOLN
INTRAMUSCULAR | Status: AC
  Filled 2021-12-11: qty 2

## 2021-12-11 MED ORDER — FENTANYL CITRATE (PF) 100 MCG/2ML IJ SOLN
25.0000 ug | INTRAMUSCULAR | Status: AC | PRN
  Administered 2021-12-11 (×4): 25 ug via INTRAVENOUS

## 2021-12-11 MED ORDER — SCOPOLAMINE 1 MG/3DAYS TD PT72
MEDICATED_PATCH | TRANSDERMAL | Status: AC
  Filled 2021-12-11: qty 1

## 2021-12-11 MED ORDER — MIDAZOLAM HCL 2 MG/2ML IJ SOLN
INTRAMUSCULAR | Status: DC | PRN
  Administered 2021-12-11: 2 mg via INTRAVENOUS

## 2021-12-11 MED ORDER — ACETAMINOPHEN 10 MG/ML IV SOLN: 1000.0000 mg | Freq: Once | INTRAVENOUS | Status: DC | PRN

## 2021-12-11 MED ORDER — PROMETHAZINE HCL 25 MG/ML IJ SOLN: 6.2500 mg | INTRAMUSCULAR | Status: DC | PRN

## 2021-12-11 MED ORDER — LACTATED RINGERS IV SOLN: INTRAVENOUS | Status: DC

## 2021-12-11 MED ORDER — LIDOCAINE HCL (CARDIAC) PF 100 MG/5ML IV SOSY
PREFILLED_SYRINGE | INTRAVENOUS | Status: DC | PRN
  Administered 2021-12-11: 60 mg via INTRAVENOUS

## 2021-12-11 MED ORDER — ROCURONIUM BROMIDE 100 MG/10ML IV SOLN
INTRAVENOUS | Status: DC | PRN
  Administered 2021-12-11: 10 mg via INTRAVENOUS
  Administered 2021-12-11: 50 mg via INTRAVENOUS
  Administered 2021-12-11 (×2): 10 mg via INTRAVENOUS

## 2021-12-11 MED ORDER — FENTANYL CITRATE (PF) 100 MCG/2ML IJ SOLN
INTRAMUSCULAR | Status: AC
  Filled 2021-12-11: qty 2

## 2021-12-11 MED ORDER — OXYCODONE HCL 5 MG/5ML PO SOLN: 5.0000 mg | Freq: Once | ORAL | Status: AC | PRN

## 2021-12-11 MED ORDER — GABAPENTIN 300 MG PO CAPS
ORAL_CAPSULE | ORAL | Status: AC
  Administered 2021-12-11: 300 mg via ORAL
  Filled 2021-12-11: qty 1

## 2021-12-11 MED ORDER — ACETAMINOPHEN 500 MG PO TABS
ORAL_TABLET | ORAL | Status: AC
  Administered 2021-12-11: 1000 mg via ORAL
  Filled 2021-12-11: qty 2

## 2021-12-11 MED ORDER — BUPIVACAINE HCL (PF) 0.5 % IJ SOLN
INTRAMUSCULAR | Status: AC
  Filled 2021-12-11: qty 30

## 2021-12-11 MED ORDER — OXYCODONE HCL 5 MG PO TABS
5.0000 mg | ORAL_TABLET | ORAL | 0 refills | Status: DC | PRN
Start: 2021-12-11 — End: 2022-06-18

## 2021-12-11 MED ORDER — POVIDONE-IODINE 10 % EX SWAB: 2.0000 "application " | Freq: Once | CUTANEOUS | Status: DC

## 2021-12-11 MED ORDER — ORAL CARE MOUTH RINSE
15.0000 mL | Freq: Once | OROMUCOSAL | Status: AC
Start: 2021-12-11 — End: 2021-12-11

## 2021-12-11 MED ORDER — SCOPOLAMINE 1 MG/3DAYS TD PT72
1.0000 | MEDICATED_PATCH | Freq: Once | TRANSDERMAL | Status: DC
  Administered 2021-12-11: 1.5 mg via TRANSDERMAL

## 2021-12-11 MED ORDER — DEXAMETHASONE SODIUM PHOSPHATE 10 MG/ML IJ SOLN
INTRAMUSCULAR | Status: AC
  Filled 2021-12-11: qty 1

## 2021-12-11 MED ORDER — FAMOTIDINE 20 MG PO TABS
20.0000 mg | ORAL_TABLET | Freq: Once | ORAL | Status: AC
Start: 2021-12-11 — End: 2021-12-11

## 2021-12-11 MED ORDER — ONDANSETRON HCL 4 MG/2ML IJ SOLN
INTRAMUSCULAR | Status: AC
  Filled 2021-12-11: qty 2

## 2021-12-11 MED ORDER — SUGAMMADEX SODIUM 200 MG/2ML IV SOLN
INTRAVENOUS | Status: DC | PRN
Start: 2021-12-11 — End: 2021-12-11
  Administered 2021-12-11: 200 mg via INTRAVENOUS

## 2021-12-11 SURGICAL SUPPLY — 49 items
ANCHOR TIS RET SYS 235ML (MISCELLANEOUS) ×3 IMPLANT
APPLICATOR ARISTA FLEXITIP XL (MISCELLANEOUS) ×2 IMPLANT
BAG URINE DRAIN 2000ML AR STRL (UROLOGICAL SUPPLIES) ×5 IMPLANT
BLADE SURG SZ11 CARB STEEL (BLADE) ×5 IMPLANT
CATH FOLEY 2WAY  5CC 16FR (CATHETERS) ×2
CATH URTH 16FR FL 2W BLN LF (CATHETERS) ×3 IMPLANT
CHLORAPREP W/TINT 26 (MISCELLANEOUS) ×14 IMPLANT
CLOSURE WOUND 1/4X4 (GAUZE/BANDAGES/DRESSINGS)
CORD MONOPOLAR M/FML 12FT (MISCELLANEOUS) ×5 IMPLANT
DERMABOND ADVANCED (GAUZE/BANDAGES/DRESSINGS) ×2
DERMABOND ADVANCED .7 DNX12 (GAUZE/BANDAGES/DRESSINGS) ×3 IMPLANT
DRAPE STERI POUCH LG 24X46 STR (DRAPES) IMPLANT
GAUZE 4X4 16PLY ~~LOC~~+RFID DBL (SPONGE) ×5 IMPLANT
GLOVE SURG ENC MOIS LTX SZ7 (GLOVE) ×10 IMPLANT
GLOVE SURG SYN 8.0 (GLOVE) ×5 IMPLANT
GLOVE SURG SYN 8.0 PF PI (GLOVE) ×2 IMPLANT
GLOVE SURG UNDER LTX SZ7.5 (GLOVE) ×5 IMPLANT
GOWN STRL REUS W/ TWL LRG LVL3 (GOWN DISPOSABLE) ×6 IMPLANT
GOWN STRL REUS W/ TWL XL LVL3 (GOWN DISPOSABLE) ×3 IMPLANT
GOWN STRL REUS W/TWL LRG LVL3 (GOWN DISPOSABLE) ×4
GOWN STRL REUS W/TWL XL LVL3 (GOWN DISPOSABLE) ×2
GRASPER SUT TROCAR 14GX15 (MISCELLANEOUS) ×3 IMPLANT
IV NS 1000ML (IV SOLUTION) ×2
IV NS 1000ML BAXH (IV SOLUTION) ×3 IMPLANT
KIT PINK PAD W/HEAD ARE REST (MISCELLANEOUS) ×5
KIT PINK PAD W/HEAD ARM REST (MISCELLANEOUS) ×3 IMPLANT
KIT TURNOVER CYSTO (KITS) ×5 IMPLANT
L-HOOK LAP DISP 36CM (ELECTROSURGICAL)
LABEL OR SOLS (LABEL) ×5 IMPLANT
LHOOK LAP DISP 36CM (ELECTROSURGICAL) IMPLANT
LIGASURE VESSEL 5MM BLUNT TIP (ELECTROSURGICAL) IMPLANT
MANIFOLD NEPTUNE II (INSTRUMENTS) ×5 IMPLANT
NS IRRIG 500ML POUR BTL (IV SOLUTION) ×5 IMPLANT
PACK GYN LAPAROSCOPIC (MISCELLANEOUS) ×5 IMPLANT
PAD OB MATERNITY 4.3X12.25 (PERSONAL CARE ITEMS) ×5 IMPLANT
PAD PREP 24X41 OB/GYN DISP (PERSONAL CARE ITEMS) ×5 IMPLANT
PENCIL ELECTRO HAND CTR (MISCELLANEOUS) IMPLANT
POUCH SPECIMEN RETRIEVAL 10MM (ENDOMECHANICALS) ×3 IMPLANT
SCISSORS METZENBAUM CVD 33 (INSTRUMENTS) ×3 IMPLANT
SCRUB EXIDINE 4% CHG 4OZ (MISCELLANEOUS) ×5 IMPLANT
SET TUBE SMOKE EVAC HIGH FLOW (TUBING) ×5 IMPLANT
SLEEVE ENDOPATH XCEL 5M (ENDOMECHANICALS) ×5 IMPLANT
STRIP CLOSURE SKIN 1/4X4 (GAUZE/BANDAGES/DRESSINGS) IMPLANT
SUT MNCRL AB 4-0 PS2 18 (SUTURE) ×5 IMPLANT
SUT VIC AB 2-0 UR6 27 (SUTURE) ×5 IMPLANT
SUT VIC AB 4-0 SH 27 (SUTURE)
SUT VIC AB 4-0 SH 27XANBCTRL (SUTURE) ×2 IMPLANT
SYR 5ML LL (SYRINGE) IMPLANT
TROCAR XCEL NON-BLD 5MMX100MML (ENDOMECHANICALS) ×5 IMPLANT

## 2021-12-11 NOTE — Discharge Instructions (Addendum)
Laparoscopic Ovarian Surgery Discharge Instructions  For the next three days, take ibuprofen and acetaminophen on a schedule, every 8 hours. You can take them together or you can intersperse them, and take one every four hours. I also gave you gabapentin for nighttime, to help you sleep and also to control pain. Take gabapentin medicines at night for at least the next 3 nights. You also have a narcotic, oxycodone, to take as needed if the above medicines don't help.  Postop constipation is a major cause of pain. Stay well hydrated, walk as you tolerate, and take over the counter senna as well as stool softeners if you need them.   RISKS AND COMPLICATIONS  Infection. Bleeding. Injury to surrounding organs. Anesthetic side effects.   PROCEDURE  You may be given a medicine to help you relax (sedative) before the procedure. You will be given a medicine to make you sleep (general anesthetic) during the procedure. A tube will be put down your throat to help your breath while under general anesthesia. Several small cuts (incisions) are made in the lower abdominal area and one incision is made near the belly button. Your abdominal area will be inflated with a safe gas (carbon dioxide). This helps give the surgeon room to operate, visualize, and helps the surgeon avoid other organs. A thin, lighted tube (laparoscope) with a camera attached is inserted into your abdomen through the incision near the belly button. Other small instruments may also be inserted through other abdominal incisions. The ovary is located and are removed. After the ovary is removed, the gas is released from the abdomen. The incisions will be closed with stitches (sutures), and Dermabond. A bandage may be placed over the incisions.  AFTER THE PROCEDURE  You will also have some mild abdominal discomfort for 3-7 days. You will be given pain medicine to ease any discomfort. As long as there are no problems, you may be allowed to  go home. Someone will need to drive you home and be with you for at least 24 hours once home. You may have some mild discomfort in the throat. This is from the tube placed in your throat while you were sleeping. You may experience discomfort in the shoulder area from some trapped air between the liver and diaphragm. This sensation is normal and will slowly go away on its own.  HOME CARE INSTRUCTIONS  Take all medicines as directed. Only take over-the-counter or prescription medicines for pain, discomfort, or fever as directed by your caregiver. Resume daily activities as directed. Showers are preferred over baths for 2 weeks. You may resume sexual activities in 1 week or as you feel you would like to. Do not drive while taking narcotics.  SEEK MEDICAL CARE IF: . There is increasing abdominal pain. You feel lightheaded or faint. You have the chills. You have an oral temperature above 102 F (38.9 C). There is pus-like (purulent) drainage from any of the wounds. You are unable to pass gas or have a bowel movement. You feel sick to your stomach (nauseous) or throw up (vomit) and can't control it with your medicines.  MAKE SURE YOU:  Understand these instructions. Will watch your condition. Will get help right away if you are not doing well or get worse.  ExitCare Patient Information 2013 [employer]     AMBULATORY SURGERY  DISCHARGE INSTRUCTIONS   The drugs that you were given will stay in your system until tomorrow so for the next 24 hours you should not:    Drive an automobile Make any legal decisions Drink any alcoholic beverage   You may resume regular meals tomorrow.  Today it is better to start with liquids and gradually work up to solid foods.  You may eat anything you prefer, but it is better to start with liquids, then soup and crackers, and gradually work up to solid foods.   Please notify your doctor immediately if you have any unusual bleeding, trouble  breathing, redness and pain at the surgery site, drainage, fever, or pain not relieved by medication.    Additional Instructions:   Please contact your physician with any problems or Same Day Surgery at [phone], Monday through Friday 6 am to 4 pm, or [facility] Main number at [phone].  

## 2021-12-11 NOTE — Anesthesia Preprocedure Evaluation (Addendum)
Anesthesia Evaluation  Patient identified by MRN, date of birth, ID band Patient awake    Reviewed: Allergy & Precautions, NPO status , Patient's Chart, lab work & pertinent test results  Airway Mallampati: II  TM Distance: >3 FB Neck ROM: full    Dental  (+) Chipped,    Pulmonary neg pulmonary ROS,    Pulmonary exam normal        Cardiovascular negative cardio ROS Normal cardiovascular exam     Neuro/Psych PSYCHIATRIC DISORDERS Anxiety negative neurological ROS     GI/Hepatic negative GI ROS, Neg liver ROS,   Endo/Other  negative endocrine ROS  Renal/GU      Musculoskeletal   Abdominal   Peds  Hematology negative hematology ROS (+)   Anesthesia Other Findings Complex ovarian cyst, right pelvic pain  Past Medical History: No date: Anxiety  Past Surgical History: 05/07/2021: fluid removed from head; N/A     Comment:  at [facility]     Reproductive/Obstetrics negative OB ROS                            Anesthesia Physical Anesthesia Plan  ASA: 2  Anesthesia Plan: General ETT   Post-op Pain Management:    Induction: Intravenous  PONV Risk Score and Plan: 4 or greater and Ondansetron, Dexamethasone, Midazolam, Propofol infusion and Scopolamine patch - Pre-op  Airway Management Planned: Oral ETT  Additional Equipment:   Intra-op Plan:   Post-operative Plan: Extubation in OR  Informed Consent: I have reviewed the patients History and Physical, chart, labs and discussed the procedure including the risks, benefits and alternatives for the proposed anesthesia with the patient or authorized representative who has indicated his/her understanding and acceptance.     Dental advisory given  Plan Discussed with: Anesthesiologist, CRNA and Surgeon  Anesthesia Plan Comments:        Anesthesia Quick Evaluation

## 2021-12-11 NOTE — Anesthesia Procedure Notes (Signed)
Procedure Name: Intubation Date/Time: 12/11/2021 1:25 PM Performed by: Loletha Grayer, CRNA Pre-anesthesia Checklist: Patient identified, Patient being monitored, Timeout performed, Emergency Drugs available and Suction available Patient Re-evaluated:Patient Re-evaluated prior to induction Oxygen Delivery Method: Circle system utilized Preoxygenation: Pre-oxygenation with 100% oxygen Induction Type: IV induction Ventilation: Mask ventilation without difficulty Laryngoscope Size: Mac and 3 Grade View: Grade I Tube type: Oral Tube size: 7.0 mm Number of attempts: 1 Airway Equipment and Method: Stylet Placement Confirmation: ETT inserted through vocal cords under direct vision, positive ETCO2 and breath sounds checked- equal and bilateral Secured at: 21 cm Tube secured with: Tape Dental Injury: Teeth and Oropharynx as per pre-operative assessment

## 2021-12-11 NOTE — Anesthesia Postprocedure Evaluation (Signed)
Anesthesia Post Note  Patient: Beth Zhang  Procedure(s) Performed: LAPAROSCOPIC OVARIAN CYSTECTOMY (Right: Abdomen) LYSIS OF ADHESION (Abdomen)  Patient location during evaluation: PACU Anesthesia Type: General Level of consciousness: awake and alert Pain management: pain level controlled Vital Signs Assessment: post-procedure vital signs reviewed and stable Respiratory status: spontaneous breathing, nonlabored ventilation, respiratory function stable and patient connected to nasal cannula oxygen Cardiovascular status: blood pressure returned to baseline and stable Postop Assessment: no apparent nausea or vomiting Anesthetic complications: no   No notable events documented.   Last Vitals:  Vitals:   12/11/21 1705 12/11/21 1709  BP:  117/72  Pulse: 74 72  Resp: 16 16  Temp:  (!) 36.4 C  SpO2: 100% 100%    Last Pain:  Vitals:   12/11/21 1709  TempSrc: Temporal  PainSc: 0-No pain                 Martha Clan

## 2021-12-11 NOTE — Transfer of Care (Signed)
Immediate Anesthesia Transfer of Care Note  Patient: Beth Zhang  Procedure(s) Performed: LAPAROSCOPIC OVARIAN CYSTECTOMY (Right: Abdomen) LYSIS OF ADHESION (Abdomen)  Patient Location: PACU  Anesthesia Type:General  Level of Consciousness: sedated  Airway & Oxygen Therapy: Patient Spontanous Breathing and Patient connected to face mask oxygen  Post-op Assessment: Report given to RN and Post -op Vital signs reviewed and stable  Post vital signs: Reviewed and stable  Last Vitals:  Vitals Value Taken Time  BP 95/58 12/11/21 1550  Temp    Pulse 56 12/11/21 1553  Resp 16 12/11/21 1553  SpO2 100 % 12/11/21 1553  Vitals shown include unvalidated device data.  Last Pain:  Vitals:   12/11/21 1201  TempSrc: Temporal  PainSc: 8          Complications: No notable events documented.

## 2021-12-11 NOTE — Op Note (Signed)
Beth Zhang PROCEDURE DATE: 12/11/2021  INDICATIONS: Complex right adnexal cyst, right sided pelvic pain  PREOPERATIVE DIAGNOSIS: Adnexal mass POSTOPERATIVE DIAGNOSIS: Complex adnexal cyst PROCEDURE: Exam under anesthesia, diagnostic laparoscopy, lysis of adhesions, right ovarian/tubal cystectomy SURGEON:  Dr. [name] ASSISTANT: Dr. Prentice Docker ANESTHESIOLOGIST: Martha Clan, MD Anesthesiologist: Iran Ouch, MD; Martha Clan, MD CRNA: Loletha Grayer, CRNA; Lily Peer, Summer, CRNA; Einar Pheasant B, CRNA  INDICATIONS: 31 y.o. F with history of right sided pelvic pain with a persistent complex right adnexal mass, desiring surgical evaluation.   Please see preoperative notes for further details.  Risks of surgery were discussed with the patient including but not limited to: bleeding which may require transfusion or reoperation; infection which may require antibiotics; injury to bowel, bladder, ureters or other surrounding organs; need for additional procedures including laparotomy; thromboembolic phenomenon, incisional problems and other postoperative/anesthesia complications. Written informed consent was obtained.    FINDINGS:  Small uterus, normal left ovary and fallopian tube. Paratubal vs ovarian cyst on right side. Right fallopian tube elongated and wrapped around cyst, with the cyst attached to both the tube and the ovary at about the level of the ovarian blood supply, but clearly separate from the normal ovarian tissue. No other abdominal/pelvic abnormality.  Normal upper abdomen. Normal appendix. Both ureters visualized. Adhesions between sigmoid colon and right pelvic sidewall that held the bowel in the pelvis. These were sharply excised. No evidence of endometriosis or prior infection.   ANESTHESIA:    General INTRAVENOUS FLUIDS: 1200 ml ESTIMATED BLOOD LOSS: 10 ml HEMOPERITONEUM: n/a URINE OUTPUT: 200 ml SPECIMENS: Ovarian cyst with pelvic  washings COMPLICATIONS: None immediate  PROCEDURE IN DETAIL:  The patient had sequential compression devices applied to her lower extremities while in the preoperative area.  She was then taken to the operating room where general anesthesia was administered and was found to be adequate.  She was placed in the dorsal lithotomy position, and was prepped and draped in a sterile manner.  A Foley catheter was inserted into her bladder and attached to constant drainage and a uterine manipulator was then advanced into the uterus .  After an adequate timeout was performed, attention was turned to the abdomen where an umbilical incision was made with the scalpel.  The Optiview 5-mm trocar and sleeve were then advanced without difficulty with the laparoscope under direct visualization into the abdomen.  The abdomen was then insufflated with carbon dioxide gas and adequate pneumoperitoneum was obtained.   A detailed survey of the patient's pelvis and abdomen revealed the findings as mentioned above. Two additional trocars were placed in the bilateral lower quadrants under direct visualization, 73mm on right and 30mm on left.  A 68mm reusable bag was placed under the ovary. Pelvic washings were taken.  The left sigmoid bowel was sharply and with cautery dissected away from the sidewall, allowing the pelvis to be evaluated. Bilateral ureters were seen. Pt placed in Trendelenburg.   The ovarian cyst was evaluated and dissected out from the tube and the normal ovarian tissue. Hemostasis was assured with electrocautery and careful dissection. The origin of the cyst was not clear, but the right fallopian tube was left intact and in situ. No damage to the tube or the fimbriae was noted. The pelvis was irrigated and all fluid and blood removed. The patient was placed in reverse Trendelenburg and all fluid removed.  The operative site was surveyed, and it was found to be hemostatic.  No intraoperative injury to surrounding  organs was noted.   Pictures were taken of the quadrants and pelvis. The abdomen was desufflated and all instruments were then removed from the patient's abdomen. The left port fascia was closed with a figure of 8. The uterine manipulator was removed without complications.  All incisions were closed with 4-0 Vicryl and Dermabond.   The patient tolerated the procedures well.  All instruments, needles, and sponge counts were correct x 2. The patient was taken to the recovery room in stable condition.

## 2021-12-11 NOTE — Interval H&P Note (Signed)
History and Physical Interval Note:  12/11/2021 12:54 PM  Beth Zhang  has presented today for surgery, with the diagnosis of complex ovarian cyst, right pelvic pain.  The various methods of treatment have been discussed with the patient and family. After consideration of risks, benefits and other options for treatment, the patient has consented to  Procedure(s): LAPAROSCOPIC OVARIAN CYSTECTOMY (Right) LAPAROSCOPIC OOPHORECTOMY (Right) possibly as a surgical intervention.  The patient's history has been reviewed, patient examined, no change in status, stable for surgery.  I have reviewed the patient's chart and labs.  Questions were answered to the patient's satisfaction.     [name]

## 2021-12-12 ENCOUNTER — Encounter: Payer: Self-pay | Admitting: Obstetrics and Gynecology

## 2021-12-12 LAB — TYPE AND SCREEN
ABO/RH(D): AB POS
Antibody Screen: NEGATIVE
Extend sample reason: UNDETERMINED

## 2021-12-13 LAB — SURGICAL PATHOLOGY

## 2021-12-13 LAB — CYTOLOGY - NON PAP

## 2022-03-15 DIAGNOSIS — N83201 Unspecified ovarian cyst, right side: Secondary | ICD-10-CM | POA: Insufficient documentation

## 2022-06-18 ENCOUNTER — Emergency Department
Admission: EM | Admit: 2022-06-18 | Discharge: 2022-06-18 | Disposition: A | Discharge status: Home / Self Care | Payer: 59 | Attending: Family Medicine | Admitting: Family Medicine

## 2022-06-18 ENCOUNTER — Encounter: Payer: Self-pay | Admitting: Emergency Medicine

## 2022-06-18 DIAGNOSIS — N938 Other specified abnormal uterine and vaginal bleeding: Secondary | ICD-10-CM

## 2022-06-18 MED ORDER — ONDANSETRON HCL 8 MG PO TABS: 8.0000 mg | ORAL_TABLET | Freq: Three times a day (TID) | ORAL | 0 refills | Status: DC | PRN

## 2022-06-18 MED ORDER — ORTHO-NOVUM 1/35 (28) 1-35 MG-MCG PO TABS: ORAL_TABLET | ORAL | 0 refills | Status: AC

## 2022-06-18 NOTE — ED Triage Notes (Signed)
Patient states that she's been having vaginal bleeding x 1 month.  Patient does have an implant in x 3 years.  Patient does have some dull right sided abdominal pain.  Denies any nausea or vomiting.

## 2022-06-18 NOTE — Discharge Instructions (Signed)
Take birth control pill 3 times a day for 7 days This will stop the bleeding High doses of estrogen sometimes cause nausea.  I have prescribed Zofran if needed Follow-up with your gynecologist

## 2022-06-18 NOTE — ED Provider Notes (Signed)
[facility]    CSN: [id number] Arrival date & time: 06/18/22  5176      History   Chief Complaint Chief Complaint  Patient presents with   Menstrual Bleeding    HPI Beth Zhang is a 31 y.o. female.   HPI  Patient is here for vaginal bleeding.  She has had vaginal bleeding for more than a month.  She states that she has a bleeding with clots that is not getting better.  She has a birth control implant for 3 years.  She has had irregular bleeding before that went away spontaneously.  This time it is not stopping.  She feels quite tired. She usually can take anti-inflammatory medicines to help with her bleeding.  She had brain surgery earlier this month and was told not to take any anti-inflammatories postoperatively for an unclear period of time.  He is afraid to take them now.  I looked in her medical record and other surgical notes, and this is not indicated.  Past Medical History:  Diagnosis Date   Anxiety     There are no problems to display for this patient.   Past Surgical History:  Procedure Laterality Date   fluid removed from head N/A 05/07/2021   at [facility] in [place] Right 12/11/2021   Procedure: LAPAROSCOPIC OVARIAN CYSTECTOMY;  Surgeon: [name], MD;  Location: [facility] ORS;  Service: Gynecology;  Laterality: Right;   LYSIS OF ADHESION  12/11/2021   Procedure: LYSIS OF ADHESION;  Surgeon: [name], MD;  Location: [facility] ORS;  Service: Gynecology;;    OB History   No obstetric history on file.      Home Medications    Prior to Admission medications   Medication Sig Start Date End Date Taking? Authorizing Provider  Ascorbic Acid (VITAMIN C PO) Take 1 tablet by mouth daily.   Yes [provider]  cetirizine-pseudoephedrine (ZYRTEC-D) 5-120 MG tablet Take 1 tablet by mouth 2 (two) times daily. Patient taking differently: Take 1 tablet by mouth daily as needed for allergies. 12/05/18  Yes Cook,  Jayce G, DO  docusate sodium (COLACE) 100 MG capsule Take 1 capsule (100 mg total) by mouth 2 (two) times daily. To keep stools soft 12/11/21  Yes [name], MD  etonogestrel (NEXPLANON) 68 MG IMPL implant 1 each by Subdermal route once.   Yes [provider]  norethindrone-ethinyl estradiol 1/35 ([place] 1/35, 28,) tablet Take one tab 3 x a day for 7 days 06/18/22  Yes Raylene Everts, MD  ondansetron [facility]) 8 MG tablet Take 1 tablet (8 mg total) by mouth every 8 (eight) hours as needed for nausea or vomiting. 06/18/22  Yes Raylene Everts, MD  gabapentin (NEURONTIN) 800 MG tablet Take 1 tablet (800 mg total) by mouth at bedtime for 14 days. Take nightly for 3 days, then up to 14 days as needed 12/11/21 12/25/21  [name], MD  Norgestimate-Ethinyl Estradiol Triphasic 0.18/0.215/0.25 MG-35 MCG tablet TAKE ONE TABLET BY MOUTH ONCE DAILY 07/14/18 02/24/21  [provider]    Family History Family History  Family history unknown: Yes    Social History Social History   Tobacco Use   Smoking status: Never   Smokeless tobacco: Never  Vaping Use   Vaping Use: Never used  Substance Use Topics   Alcohol use: Never   Drug use: Never     Allergies   Patient has no known allergies.   Review of Systems Review of  Systems See HPI  Physical Exam Triage Vital Signs ED Triage Vitals [06/18/22 0902]  Enc Vitals Group     BP 121/79     Pulse Rate 71     Resp 18     Temp 98.7 F (37.1 C)     Temp Source Oral     SpO2 100 %     Weight 154 lb (69.9 kg)     Height '5\' 4"'$  (1.626 m)     Head Circumference      Peak Flow      Pain Score 4     Pain Loc      Pain Edu?      Excl. in [place]?    No data found.  Updated Vital Signs BP 121/79 (BP Location: Left Arm)   Pulse 71   Temp 98.7 F (37.1 C) (Oral)   Resp 18   Ht '5\' 4"'$  (1.626 m)   Wt 69.9 kg   SpO2 100%   BMI 26.43 kg/m       Physical Exam Constitutional:      General: She is not in  acute distress.    Appearance: Normal appearance. She is well-developed and normal weight.  HENT:     Head: Normocephalic and atraumatic.     Right Ear: Tympanic membrane and ear canal normal.     Left Ear: Tympanic membrane and ear canal normal.     Nose: Nose normal.     Mouth/Throat:     Mouth: Mucous membranes are moist.     Pharynx: No posterior oropharyngeal erythema.  Eyes:     Conjunctiva/sclera: Conjunctivae normal.     Pupils: Pupils are equal, round, and reactive to light.  Cardiovascular:     Rate and Rhythm: Normal rate and regular rhythm.     Heart sounds: Normal heart sounds.  Pulmonary:     Effort: Pulmonary effort is normal. No respiratory distress.     Breath sounds: Normal breath sounds.  Abdominal:     General: There is no distension.     Palpations: Abdomen is soft.     Tenderness: There is abdominal tenderness.     Comments: Tenderness to deep palpation in the right lower quadrant.  No guarding or rebound.  No palpable mass  Musculoskeletal:        General: Normal range of motion.     Cervical back: Normal range of motion.  Skin:    General: Skin is warm and dry.  Neurological:     General: No focal deficit present.     Mental Status: She is alert.  Psychiatric:        Mood and Affect: Mood normal.        Behavior: Behavior normal.      UC Treatments / Results  Labs (all labs ordered are listed, but only abnormal results are displayed) Labs Reviewed - No data to display  EKG   Radiology No results found.  Procedures Procedures (including critical care time)  Medications Ordered in UC Medications - No data to display  Initial Impression / Assessment and Plan / UC Course  I have reviewed the triage vital signs and the nursing notes.  Pertinent labs & imaging results that were available during my care of the patient were reviewed by me and considered in my medical decision making (see chart for details).     Final Clinical  Impressions(s) / UC Diagnoses   Final diagnoses:  DUB (dysfunctional uterine bleeding)  Discharge Instructions      Take birth control pill 3 times a day for 7 days This will stop the bleeding High doses of estrogen sometimes cause nausea.  I have prescribed Zofran if needed Follow-up with your gynecologist   ED Prescriptions     Medication Sig Dispense Auth. Provider   norethindrone-ethinyl estradiol 1/35 ([place] 1/35, 28,) tablet Take one tab 3 x a day for 7 days 28 tablet Raylene Everts, MD   ondansetron (ZOFRAN) 8 MG tablet Take 1 tablet (8 mg total) by mouth every 8 (eight) hours as needed for nausea or vomiting. 20 tablet Raylene Everts, MD      PDMP not reviewed this encounter.   Raylene Everts, MD 06/18/22 [phone]

## 2022-06-27 ENCOUNTER — Encounter: Payer: Self-pay | Admitting: Emergency Medicine

## 2022-06-27 ENCOUNTER — Ambulatory Visit
Admission: EM | Admit: 2022-06-27 | Discharge: 2022-06-27 | Disposition: A | Discharge status: Home / Self Care | Payer: PRIVATE HEALTH INSURANCE | Attending: Family Medicine | Admitting: Family Medicine

## 2022-06-27 DIAGNOSIS — N938 Other specified abnormal uterine and vaginal bleeding: Secondary | ICD-10-CM | POA: Insufficient documentation

## 2022-06-27 DIAGNOSIS — Z4802 Encounter for removal of sutures: Secondary | ICD-10-CM | POA: Diagnosis not present

## 2022-06-27 NOTE — ED Triage Notes (Signed)
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  Per Dr Assunta Found, he will remove these.

## 2022-06-27 NOTE — Discharge Instructions (Signed)
Call your neurosurgeon for increasing redness, swelling, pain, heat, drainage, etc.

## 2022-06-27 NOTE — ED Provider Notes (Signed)
[facility]    CSN: [id number] Arrival date & time: 06/27/22  1818      History   Chief Complaint Chief Complaint  Patient presents with   Suture Removal    HPI Neyra Pettie is a 31 y.o. female.   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 Tennis Must.  She had no post-op complications and presents for suture removal.  She has no complaints.    The history is provided by the patient and a friend.    Past Medical History:  Diagnosis Date   Anxiety     There are no problems to display for this patient.   Past Surgical History:  Procedure Laterality Date   fluid removed from head N/A 05/07/2021   at [facility] in [place] Right 12/11/2021   Procedure: LAPAROSCOPIC OVARIAN CYSTECTOMY;  Surgeon: [name], MD;  Location: [facility] ORS;  Service: Gynecology;  Laterality: Right;   LYSIS OF ADHESION  12/11/2021   Procedure: LYSIS OF ADHESION;  Surgeon: [name], MD;  Location: [facility] ORS;  Service: Gynecology;;    OB History   No obstetric history on file.      Home Medications    Prior to Admission medications   Medication Sig Start Date End Date Taking? Authorizing Provider  Ascorbic Acid (VITAMIN C PO) Take 1 tablet by mouth daily.   Yes [provider]  cetirizine-pseudoephedrine (ZYRTEC-D) 5-120 MG tablet Take 1 tablet by mouth 2 (two) times daily. Patient taking differently: Take 1 tablet by mouth daily as needed for allergies. 12/05/18  Yes Cook, Jayce G, DO  docusate sodium (COLACE) 100 MG capsule Take 1 capsule (100 mg total) by mouth 2 (two) times daily. To keep stools soft 12/11/21  Yes [name], MD  etonogestrel (NEXPLANON) 68 MG IMPL implant 1 each by Subdermal route once.   Yes [provider]  norethindrone-ethinyl estradiol 1/35 ([place] 1/35, 28,) tablet Take one tab 3 x a day for 7 days 06/18/22  Yes Raylene Everts, MD  ondansetron  [facility]) 8 MG tablet Take 1 tablet (8 mg total) by mouth every 8 (eight) hours as needed for nausea or vomiting. 06/18/22  Yes Raylene Everts, MD  gabapentin (NEURONTIN) 800 MG tablet Take 1 tablet (800 mg total) by mouth at bedtime for 14 days. Take nightly for 3 days, then up to 14 days as needed 12/11/21 12/25/21  [name], MD  Norgestimate-Ethinyl Estradiol Triphasic 0.18/0.215/0.25 MG-35 MCG tablet TAKE ONE TABLET BY MOUTH ONCE DAILY 07/14/18 02/24/21  [provider]    Family History Family History  Family history unknown: Yes    Social History Social History   Tobacco Use   Smoking status: Never   Smokeless tobacco: Never  Vaping Use   Vaping Use: Never used  Substance Use Topics   Alcohol use: Never   Drug use: Never     Allergies   Patient has no known allergies.   Review of Systems Review of Systems  Constitutional:  Negative for chills, diaphoresis, fatigue and fever.  Gastrointestinal:  Negative for nausea and vomiting.  Neurological:  Negative for dizziness, tremors, seizures, syncope, facial asymmetry, speech difficulty, weakness, light-headedness, numbness and headaches.  All other systems reviewed and are negative.    Physical Exam Triage Vital Signs ED Triage Vitals  Enc Vitals Group     BP 06/27/22 1918 119/79     Pulse Rate 06/27/22 1918 62  Resp 06/27/22 1918 18     Temp 06/27/22 1918 99.7 F (37.6 C)     Temp Source 06/27/22 1918 Oral     SpO2 06/27/22 1918 100 %     Weight --      Height --      Head Circumference --      Peak Flow --      Pain Score 06/27/22 1920 0     Pain Loc --      Pain Edu? --      Excl. in [place]? --    No data found.  Updated Vital Signs BP 119/79 (BP Location: Right Arm)   Pulse 62   Temp 99.7 F (37.6 C) (Oral)   Resp 18   SpO2 100%   Visual Acuity Right Eye Distance:   Left Eye Distance:   Bilateral Distance:    Right Eye Near:   Left Eye Near:    Bilateral Near:     Physical  Exam Vitals and nursing note reviewed.  Constitutional:      General: She is not in acute distress. HENT:     Head: Normocephalic.      Comments: Well-healed 4cm long surgical wound closed with continuous nylon suture frontal scalp as noted on diagram.  No evidence cellulitis.      Right Ear: External ear normal.     Left Ear: External ear normal.     Nose: Nose normal.  Eyes:     Conjunctiva/sclera: Conjunctivae normal.     Pupils: Pupils are equal, round, and reactive to light.  Cardiovascular:     Rate and Rhythm: Normal rate.  Pulmonary:     Effort: Pulmonary effort is normal.  Skin:    General: Skin is warm and dry.  Neurological:     Mental Status: She is alert and oriented to person, place, and time.      UC Treatments / Results  Labs (all labs ordered are listed, but only abnormal results are displayed) Labs Reviewed - No data to display  EKG   Radiology No results found.  Procedures Procedures  Suture removal: Removed without difficulty a running nylon suture from well healed laceration anterior scalp as noted on diagram.  Healed surgical wound has no drainage, erythema or tenderness.  Applied bacitracin.  Medications Ordered in UC Medications - No data to display  Initial Impression / Assessment and Plan / UC Course  I have reviewed the triage vital signs and the nursing notes.  Pertinent labs & imaging results that were available during my care of the patient were reviewed by me and considered in my medical decision making (see chart for details).    Surgical wound appears well healed without evidence infection.  Followup with neurosurgeon as scheduled.  Final Clinical Impressions(s) / UC Diagnoses   Final diagnoses:  Visit for suture removal     Discharge Instructions      Call your neurosurgeon for increasing redness, swelling, pain, heat, drainage, etc.       ED Prescriptions   None       Kandra Nicolas, MD 06/29/22 1115

## 2022-08-22 DIAGNOSIS — R102 Pelvic and perineal pain: Secondary | ICD-10-CM | POA: Insufficient documentation

## 2022-12-06 ENCOUNTER — Other Ambulatory Visit: Payer: Self-pay | Admitting: Nurse Practitioner

## 2022-12-06 ENCOUNTER — Ambulatory Visit (INDEPENDENT_AMBULATORY_CARE_PROVIDER_SITE_OTHER): Payer: Self-pay

## 2022-12-06 DIAGNOSIS — M545 Low back pain, unspecified: Secondary | ICD-10-CM

## 2024-01-28 ENCOUNTER — Ambulatory Visit
Admission: EM | Admit: 2024-01-28 | Discharge: 2024-01-28 | Disposition: A | Discharge status: Home / Self Care | Attending: Emergency Medicine | Admitting: Emergency Medicine

## 2024-01-28 ENCOUNTER — Encounter: Payer: Self-pay | Admitting: Emergency Medicine

## 2024-01-28 DIAGNOSIS — Z20822 Contact with and (suspected) exposure to covid-19: Secondary | ICD-10-CM | POA: Insufficient documentation

## 2024-01-28 DIAGNOSIS — K529 Noninfective gastroenteritis and colitis, unspecified: Secondary | ICD-10-CM | POA: Insufficient documentation

## 2024-01-28 HISTORY — DX: Hydrocephalus, unspecified: G91.9

## 2024-01-28 LAB — RESP PANEL BY RT-PCR (FLU A&B, COVID) ARPGX2
Influenza A by PCR: NEGATIVE
Influenza B by PCR: NEGATIVE
SARS Coronavirus 2 by RT PCR: NEGATIVE

## 2024-01-28 MED ORDER — ONDANSETRON 8 MG PO TBDP
8.0000 mg | ORAL_TABLET | Freq: Once | ORAL | Status: AC
  Administered 2024-01-28: 8 mg via ORAL

## 2024-01-28 MED ORDER — ONDANSETRON 8 MG PO TBDP: ORAL_TABLET | ORAL | 0 refills | Status: AC

## 2024-01-28 NOTE — ED Provider Notes (Signed)
 HPI  SUBJECTIVE:  Beth Zhang is a 33 y.o. female who presents with the acute onset of chills, body aches, nausea, 8 episodes of nonbilious, nonbloody emesis starting 1830 last night.  She reports diffuse, constant, sharp/dull, nonmigratory, nonradiating upper abdominal pain during this time.  States that she is unable to tolerate any p.o. whatsoever, including fluids.  She denies eating new or different foods prior to the symptoms starting.  No fevers, headaches, nasal congestion, rhinorrhea, sore throat, coughing, wheezing, shortness of breath.  No abdominal distention, urinary complaints, change in urine output, back pain.  The car ride over here was not painful.  No raw undercooked foods, questionable leftovers, recent travel or antibiotics.  No known exposure to flu, COVID, gastroenteritis.  Her last bowel movement was 4 days ago, this is within normal stooling patterns for her.  She has not tried anything for her symptoms.  No alleviating factors.  Her abdominal pain is worse after vomiting.  It is not associated with p.o. intake, movement, urination.  She has a past medical history obstructive hydrocephalus, status post lysis of adhesions, ovarian cystectomy.  No history of gallbladder disease, pancreatitis, mesenteric ischemia, atrial fibrillation, hypercoagulability, chronic kidney disease, diabetes, hypertension.  She drinks alcohol occasionally.  Denies any alcohol last night.  LMP: The end of last month.  Denies the chance of being pregnant.  [facility]  Past Medical History:  Diagnosis Date   Anxiety    Hydrocephalus [facility])     Past Surgical History:  Procedure Laterality Date   fluid removed from head N/A 05/07/2021   at [facility] in [place]   LAPAROSCOPIC OVARIAN CYSTECTOMY Right 12/11/2021   Procedure: LAPAROSCOPIC OVARIAN CYSTECTOMY;  Surgeon: Christeen Douglas, MD;  Location: [facility] ORS;  Service: Gynecology;  Laterality: Right;   LYSIS OF ADHESION  12/11/2021   Procedure:  LYSIS OF ADHESION;  Surgeon: Christeen Douglas, MD;  Location: [facility] ORS;  Service: Gynecology;;    Family History  Family history unknown: Yes    Social History   Tobacco Use   Smoking status: Never   Smokeless tobacco: Never  Vaping Use   Vaping status: Never Used  Substance Use Topics   Alcohol use: Never   Drug use: Never    No current facility-administered medications for this encounter.  Current Outpatient Medications:    ondansetron (ZOFRAN-ODT) 8 MG disintegrating tablet, 1/2- 1 tablet q 8 hr prn nausea, vomiting, Disp: 20 tablet, Rfl: 0   Ascorbic Acid (VITAMIN C PO), Take 1 tablet by mouth daily., Disp: , Rfl:    cetirizine-pseudoephedrine (ZYRTEC-D) 5-120 MG tablet, Take 1 tablet by mouth 2 (two) times daily. (Patient taking differently: Take 1 tablet by mouth daily as needed for allergies.), Disp: 30 tablet, Rfl: 0   docusate sodium (COLACE) 100 MG capsule, Take 1 capsule (100 mg total) by mouth 2 (two) times daily. To keep stools soft, Disp: 30 capsule, Rfl: 0   etonogestrel (NEXPLANON) 68 MG IMPL implant, 1 each by Subdermal route once., Disp: , Rfl:    gabapentin (NEURONTIN) 800 MG tablet, Take 1 tablet (800 mg total) by mouth at bedtime for 14 days. Take nightly for 3 days, then up to 14 days as needed, Disp: 14 tablet, Rfl: 0   norethindrone-ethinyl estradiol 1/35 ([place]THO-NOV[place] 1/35, 28,) tablet, Take one tab 3 x a day for 7 days, Disp: 28 tablet, Rfl: 0  No Known Allergies   ROS  As noted in HPI.   Physical Exam  BP 116/71 (BP Location: Left  Arm)   Pulse 96   Temp 99.4 F (37.4 C) (Oral)   Resp 16   Ht 5\' 4"  (1.626 m)   Wt 63.5 kg   LMP 01/13/2024 (Approximate)   SpO2 100%   BMI 24.03 kg/m   Constitutional: Well developed, well nourished, no acute distress Eyes: PERRL, EOMI, conjunctiva normal bilaterally HENT: Normocephalic, atraumatic,mucus membranes moist Respiratory: Clear to auscultation bilaterally, no rales, no wheezing, no  rhonchi Cardiovascular: Normal rate and rhythm, no murmurs, no gallops, no rubs capillary refill less than 2 seconds. GI: Soft, nondistended, hypoactive bowel sounds, right upper quadrant, periumbilical, epigastric, left lower quadrant tenderness, no rebound, no guarding.  Negative tap table test.  Tenderness maximal in the right upper quadrant and periumbilical region.  Negative Murphy.  Negative McBurney. Back: no CVAT skin: No rash, skin intact Musculoskeletal: no deformities Neurologic: Alert & oriented x 3, CN III-XII grossly intact, no motor deficits, sensation grossly intact Psychiatric: Speech and behavior appropriate   ED Course   Medications  ondansetron (ZOFRAN-ODT) disintegrating tablet 8 mg (8 mg Oral Given 01/28/24 1004)    Orders Placed This Encounter  Procedures   Resp Panel by RT-PCR (Flu A&B, Covid) Anterior Nasal Swab    Standing Status:   Standing    Number of Occurrences:   1   Offer Fluids    Standing Status:   Standing    Number of Occurrences:   20   Results for orders placed or performed during the hospital encounter of 01/28/24 (from the past 24 hours)  Resp Panel by RT-PCR (Flu A&B, Covid) Anterior Nasal Swab     Status: None   Collection Time: 01/28/24  9:35 AM   Specimen: Anterior Nasal Swab  Result Value Ref Range   SARS Coronavirus 2 by RT PCR NEGATIVE NEGATIVE   Influenza A by PCR NEGATIVE NEGATIVE   Influenza B by PCR NEGATIVE NEGATIVE   No results found.  ED Clinical Impression  1. Gastroenteritis   2. Encounter for laboratory testing for COVID-19 virus   3. Lab test negative for COVID-19 virus      ED Assessment/Plan     Patient's abdomen is benign.  However in the differential is gastroenteritis, COVID/flu, pancreatitis, cholelithiasis.  Doubt cholecystitis.  Doubt obstruction, perforation, mesenteric ischemia.  Offered to do labs including CBC, CMP, UA and lipase, or, because she appears well-hydrated, has normal vitals, we could  try treating her supportively with Zofran and oral rehydration with the understanding that if she gets worse in any way, she will go to the ED for labs and imaging.  She has opted for the latter, deferring labs today.  COVID, influenza negative.  Discussed with patient while in department.  Gave 8 mg of Zofran and electrolyte containing fluids p.o. here, which she tolerated.  Home with Zofran, electrolyte containing fluids.  Patient declined work note.  Discussed labs,  MDM, treatment plan, and plan for follow-up with patient Discussed sn/sx that should prompt return to the ED. patient agrees with plan.   Meds ordered this encounter  Medications   ondansetron (ZOFRAN-ODT) disintegrating tablet 8 mg   ondansetron (ZOFRAN-ODT) 8 MG disintegrating tablet    Sig: 1/2- 1 tablet q 8 hr prn nausea, vomiting    Dispense:  20 tablet    Refill:  0      *This clinic note was created using Scientist, clinical (histocompatibility and immunogenetics). Therefore, there may be occasional mistakes despite careful proofreading. ?    Domenick Gong, MD 01/28/24 1019

## 2024-01-28 NOTE — ED Triage Notes (Signed)
 Pt c/o abd pain,emesis,bodyaches & chills x1 day. Had 1 episode of emesis today. States unable to keep any foods or fluids down.

## 2024-01-28 NOTE — Discharge Instructions (Addendum)
 COVID and influenza are negative.  Continue pushing electrolyte containing fluids such as liquid IV, Pedialyte or Gatorade until your urine is clear.  Take the Zofran up to 3 times a day as needed for nausea and vomiting.  It may make you constipated.  While your abdomen was benign here, you could have a problem with your pancreas or gallbladder.  Go immediately to the emergency department for comprehensive workup including labs and imaging if your pain changes, gets worse, if you are vomiting despite the Zofran, if you have not urinated in 12 hours, fevers above 100.4, or for other concerns.
